# Patient Record
Sex: Female | Born: 1985 | Race: White | Hispanic: No | Marital: Married
Health system: Southern US, Community
[De-identification: ages and names within clinical notes are randomized; demographics above are authoritative.]

## PROBLEM LIST (undated history)

## (undated) DIAGNOSIS — D249 Benign neoplasm of unspecified breast: Secondary | ICD-10-CM

## (undated) HISTORY — DX: Benign neoplasm of unspecified breast: D24.9

## (undated) HISTORY — PX: ANTERIOR CRUCIATE LIGAMENT REPAIR: SHX115

---
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
## 2007-03-30 ENCOUNTER — Encounter (INDEPENDENT_AMBULATORY_CARE_PROVIDER_SITE_OTHER): Payer: Self-pay | Admitting: Obstetrics and Gynecology

## 2007-03-30 ENCOUNTER — Observation Stay (HOSPITAL_COMMUNITY): Admission: AD | Admit: 2007-03-30 | Discharge: 2007-03-31 | Payer: Self-pay | Admitting: Obstetrics and Gynecology

## 2007-03-30 IMAGING — US US OB COMP LESS 14 WK
1 series · 14 of 28 positions shown · non-contrast
Comparison: none

HISTORY: Early pregnancy, bleeding, possible miscarriage, LMP [DATE]

[Series 1: us pelvis complete · 14 of 45 slices shown]
[im 2/45]
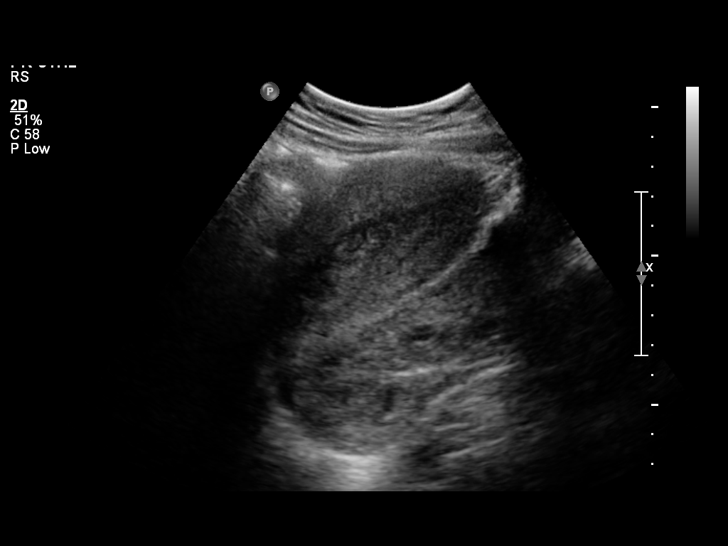
[im 5/45]
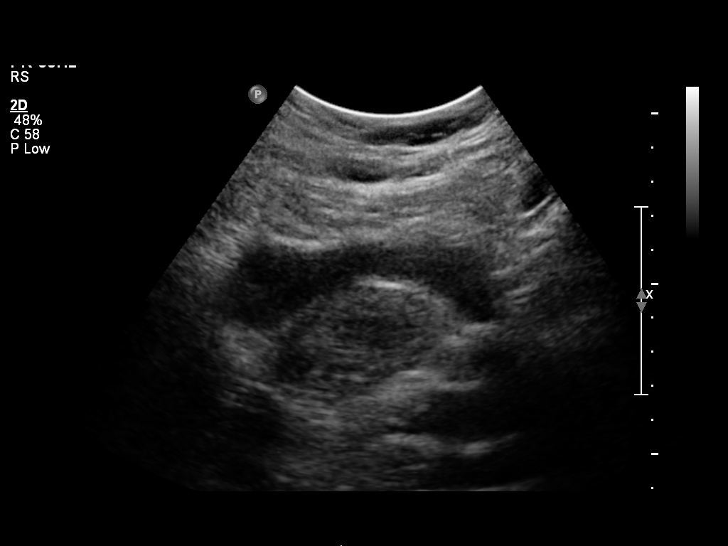
[im 9/45]
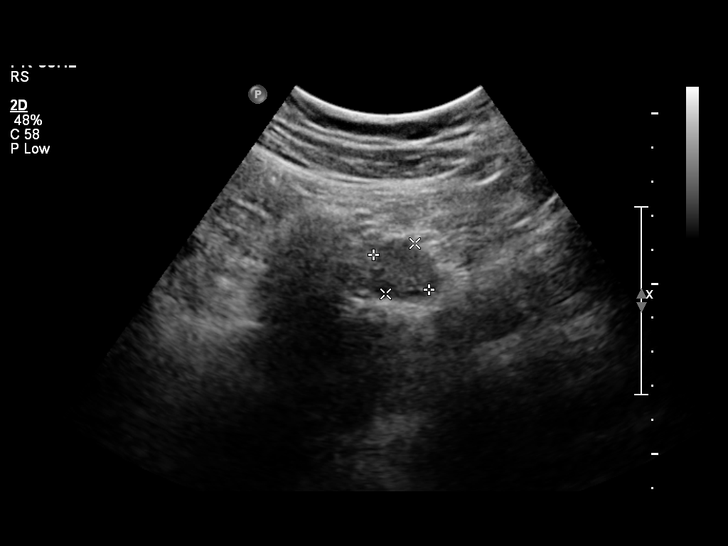
[im 12/45]
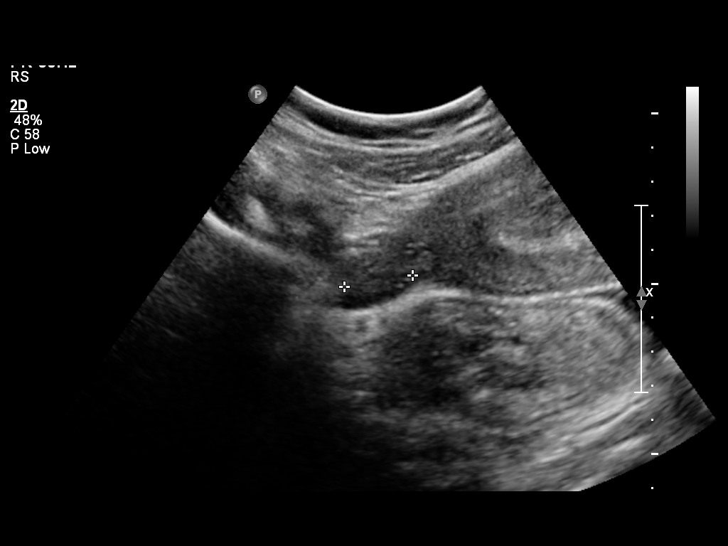
[im 15/45]
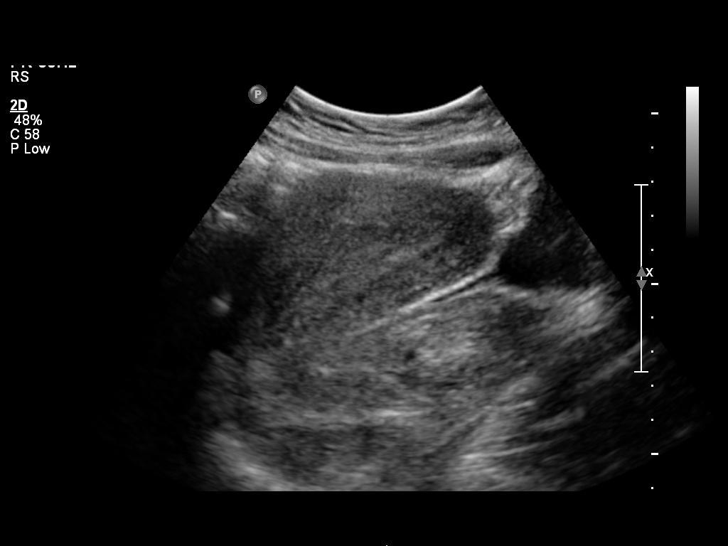
[im 18/45]
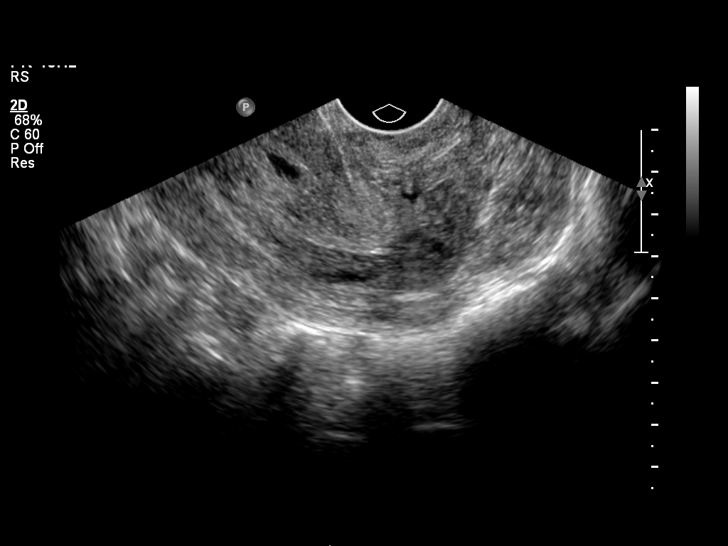
[im 22/45]
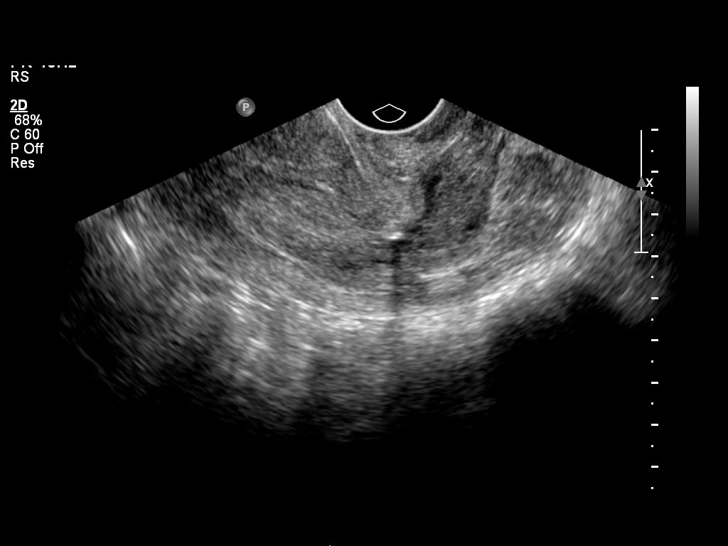
[im 25/45]
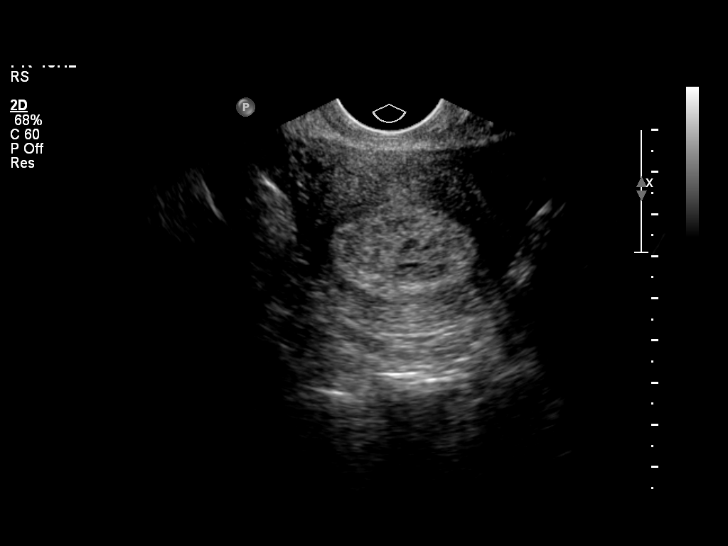
[im 28/45]
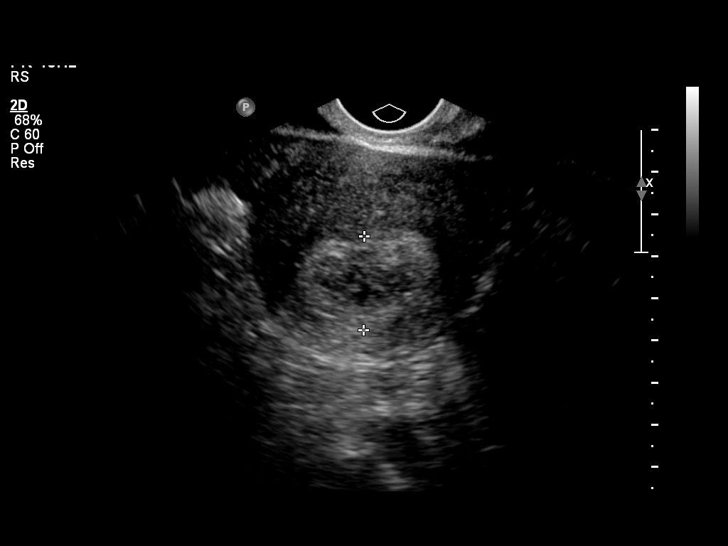
[im 31/45]
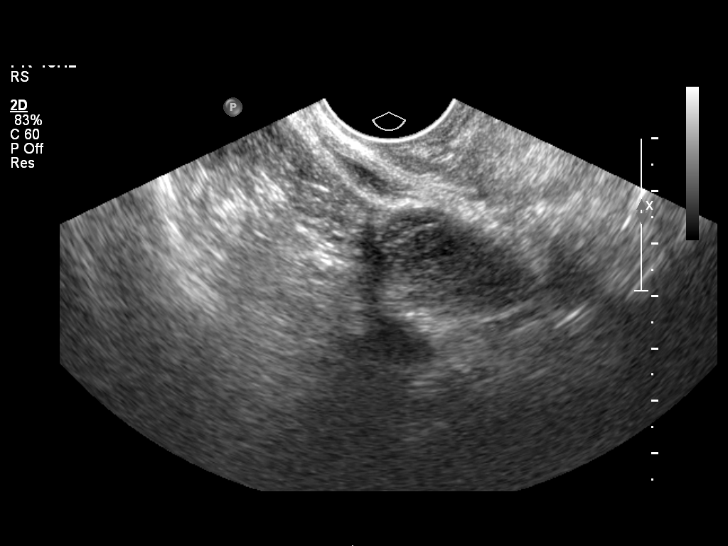
[im 35/45]
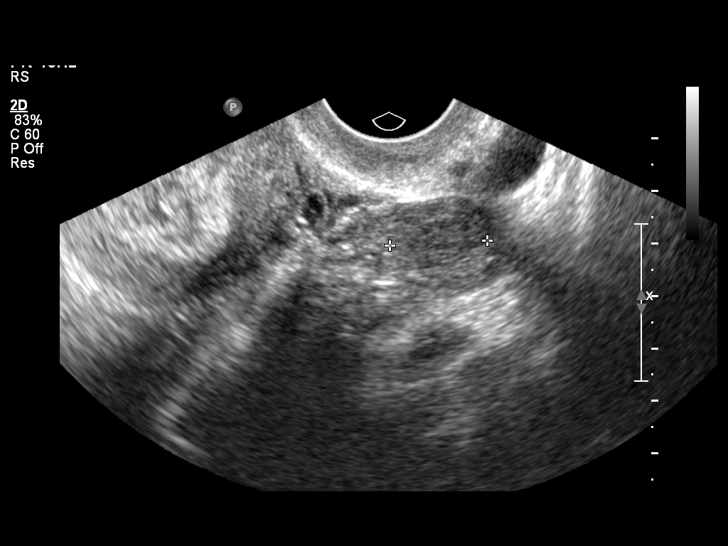
[im 38/45]
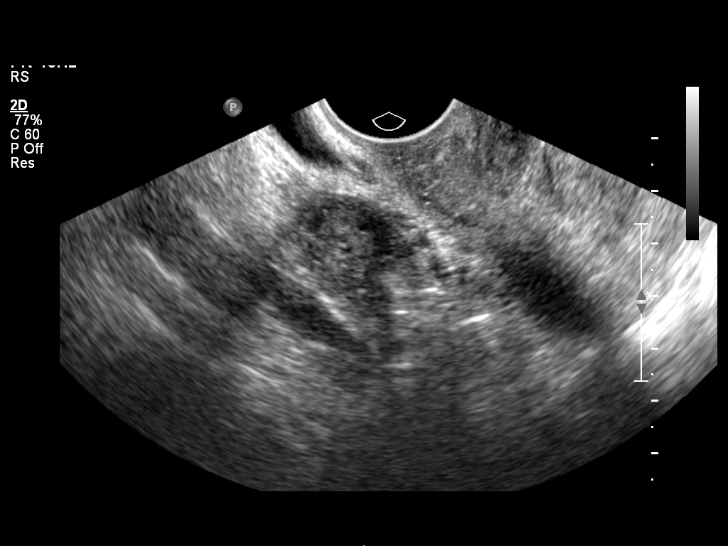
[im 41/45]
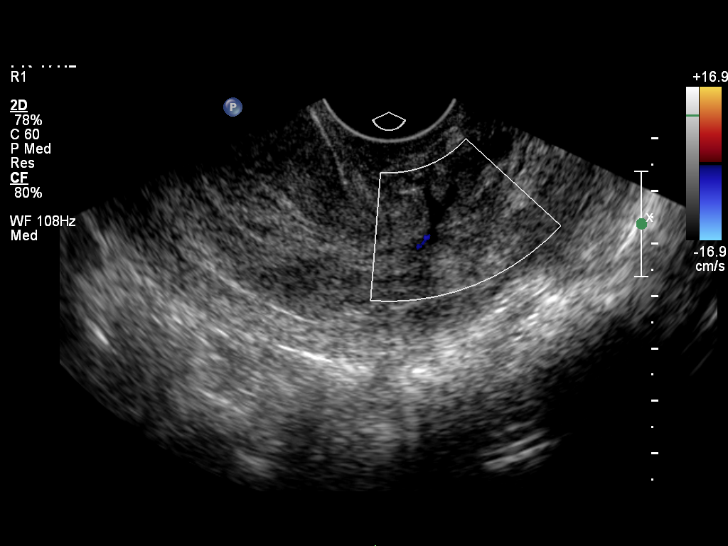
[im 45/45]
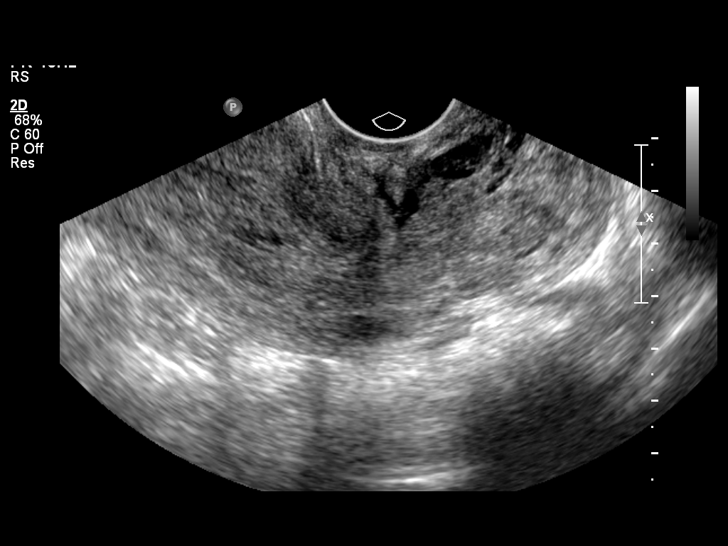

[14 of 28 positions shown; findings below may reference images not displayed]

ULTRASOUND OB COMPLETE LESS THAN 14 WEEKS:
ULTRASOUND OB TRANSVAGINAL MODIFY:

Transabdominal and endovaginal sonography of the gravid pelvis performed.

No gestational sac seen within uterus.
Minimal endometrial fluid.
Endometrial complex is thickened and irregular, hypoechoic, up [L7] cm
diameter, question blood.
This extends to the cervix.
Left ovary normal size and morphology, 2.7 x 1.3 x 1.9 cm.
Right ovary normal size and morphology, 2.8 x 1.5 x 1.3 cm.
No adnexal masses or free pelvic fluid.
IMPRESSION: No identification of intrauterine or extrauterine gestation.
With quantitative beta-hCG level of [L7], differential diagnosis includes
spontaneous abortion and ectopic pregnancy, though no secondary signs of ectopic
pregnancy are identified.
Serial quantitative beta-hCG and/or ultrasound followup recommended to confirm
spontaneous abortion and exclude ectopic pregnancy.

## 2007-11-09 ENCOUNTER — Encounter: Admission: RE | Admit: 2007-11-09 | Discharge: 2007-11-09 | Payer: Self-pay | Admitting: Obstetrics and Gynecology

## 2011-01-08 NOTE — H&P (Signed)
NAME:  Jamie Daniel, Jamie Daniel NO.:  [id number]   MEDICAL RECORD NO.:  [id number]          PATIENT TYPE:  MAT   LOCATION:  [facility]                          FACILITY:  [facility]   PHYSICIAN:  Naima A. Dillard, M.D. DATE OF BIRTH:  Mar 24, 1986   DATE OF ADMISSION:  03/30/2007  DATE OF DISCHARGE:                              HISTORY & PHYSICAL   This is a 25 year old gravida 1, para 0 at 12-2/7th's weeks' gestation  who presents with heavy bleeding.  She was diagnosed with a missed  abortion on March 19, 2007, and started having heavy vaginal bleeding  today.  Soaking greater than one pad per hour.  She declined a D&E and  did pass a lot of blood clots and tissue while in the maternity  admissions unit per Dr. Normand Sloop.   ALLERGIES:  None.   OBSTETRICAL HISTORY:  The patient is a primigravida.   MEDICAL HISTORY:  Unremarkable.   SURGICAL HISTORY:  Remarkable for:  1. Knee surgery.  2. Bilateral inguinal hernia repairs.   FAMILY HISTORY:  Remarkable for father with hypertension and grandfather  with diabetes.   GENETIC HISTORY:  Negative.   SOCIAL HISTORY:  The patient is married.  Father of the baby is involved  and supportive.  She does not report a religious affiliation.  She  denies any alcohol, tobacco, or drug use.   MEDICATIONS:  Midol and Motrin.   PRENATAL LABORATORY:  Blood type is O negative.   OBJECTIVE DATA:  VITAL SIGNS:  Stable but the patient is orthostatic  with heart rate going from 79 to 104 from lying to sitting and the  patient is unable to tolerate standing without feeling faint.  HEENT:  Within normal limits.  Thyroid normal, not enlarged.  CHEST:  Clear to auscultation.  HEART:  Regular rate and rhythm.  ABDOMEN:  Soft and nontender.  PELVIC:  Showed cervix which was open fingertip and large blood clots  and products of conception were present at cervical os and removed by  Dr. Normand Sloop.  There was moderate vaginal bleeding which has subsided.  EXTREMITIES:  Within normal limits.   Ultrasound showed no gestation sack in the uterus, minimal endometrial  fluid, endometrial stripe of 1.9-cm with blood seen and the uterus  extending to the cervix.  Ovaries were normal.  There were no adnexal  masses.  Hemoglobin was initially 11.5, and decreased to 10.1.   ASSESSMENT:  1. Intrauterine pregnancy at 12 weeks and 2 days' gestation.  2. Spontaneous abortion.  3. Orthostasis related to blood loss.   PLAN:  1. Admit to 3rd floor for overnight observation.  2. Bed rest with bathroom privileges with assistance.  3. Methergine series.  4. CBC and Rhophylac in a.m.  5. Pad count.  6. Regular diet.  7. Motrin p.r.n.  8. Further orders to follow.      Marie L. Williams, C.N.M.      Naima A. Normand Sloop, M.D.  Electronically Signed    MLW/MEDQ  D:  03/30/2007  T:  03/30/2007  Job:  [id number]

## 2011-01-08 NOTE — Discharge Summary (Signed)
NAME:  Jamie Daniel, Jamie Daniel NO.:  [id number]   MEDICAL RECORD NO.:  [id number]          PATIENT TYPE:  OBV   LOCATION:  9302                          [facility]ACILITY:  [facility]   PHYSICIAN:  Hal Morales, M.D.DATE OF BIRTH:  [birth year]   DATE OF ADMISSION:  03/30/2007  DATE OF DISCHARGE:  03/31/2007                               DISCHARGE SUMMARY   ADMITTING DIAGNOSES:  1. Intrauterine pregnancy at 12-1/7 weeks with a missed abortion.  2. Heavy vaginal bleeding.   PROCEDURES:  None.   HOSPITAL COURSE:  Ms. [name] is a 25 year old gravida 1, para 0 at 41-  2/[redacted] weeks gestation who presented with heavy bleeding on the evening of  March 30, 2007.  Her history had been remarkable for diagnosis with a  missed abortion on March 19, 2007, which she had elected to maintain  observation and began to have heavy vaginal bleeding today.  On  admission to the maternity admissions unit, the patient was orthostatic  with heart rate changes.  She was unable to tolerate standing without  feeling faint.  Cervix was fingertip dilated with large blood clots and  products of conception being present at the cervical os.  These were  removed by Dr. Normand Sloop and there was moderate vaginal bleeding which  subsided at that time.  Ultrasound showed no gestational sac in the  uterus.  Minimal endometrial fluid, endometrial stripe of 1.9 cm with  blood seen and the ovaries were normal.  No adnexal masses.  Hemoglobin  was initially 11.5 on admission and did decrease to 10.1 during the  patient's MAU stay.  The decision was made to admit her for observation  through the night.  She did receive Methergine.  She tolerated a regular  diet.  She did receive RhoGAM.  By the morning of March 31, 2007, the  patient was feeling much better.  She was up to the bathroom without any  dizziness.  She tolerated a regular diet.  Blood pressures were in the  90s/50s, pulse was 70.  Abdomen was soft and nontender.   Bleeding was  minimal.  Hemoglobin was 9.  The patient was evaluated by Dr. Pennie Rushing  and was deemed to have received full benefit of her hospital stay and  was discharged home.   DISCHARGE INSTRUCTIONS:  The patient is to call our office with any  increase in vaginal bleeding, increased pain or any other problems.   DISCHARGE MEDICATIONS:  1. Doxycycline 100 mg p.o. b.i.d. x7 days.  2. Methergine 0.2 mg p.o. q.6h. x24 hours.  3. Motrin 600 mg p.o. q.6h. p.r.n. pain.   FOLLOW UP:  Discharge follow-up will occur with Dr. Normand Sloop in one week  at [facility] or p.r.n.      Renaldo Reel Emilee Hero, C.N.M.      Hal Morales, M.D.  Electronically Signed    VLL/MEDQ  D:  03/31/2007  T:  03/31/2007  Job:  [id number]

## 2011-01-08 NOTE — H&P (Signed)
NAME:  Jamie Daniel, Jamie Daniel             ACCOUNT NO.:  [id number]   MEDICAL RECORD NO.:  [id number]          PATIENT TYPE:  OBV   LOCATION:  9302                          [facility]   PHYSICIAN:  Naima A. Dillard, M.D. DATE OF BIRTH:  [birth year]   DATE OF ADMISSION:  03/30/2007  DATE OF DISCHARGE:                              HISTORY & PHYSICAL   CHIEF COMPLAINT:  Incomplete abortion.   PRESENT ILLNESS:  The patient was diagnosed with a missed AB measuring 6  weeks on March 19, 2007.  She chose observation and today she started  having heavy vaginal bleeding, soaking greater than a pad an hour.  There was no leakage of fluid, no shortness of breath and no chest pain.   PAST MEDICAL HISTORY:  Unremarkable.   PAST SURGICAL HISTORY:  Significant for knee surgery and bilateral  inguinal repair.   FAMILY HISTORY:  Significant for chronic hypertension and aortic  dissection the father, and diabetes in a grandfather.   SOCIAL HISTORY:  No tobacco or alcohol use.   The patient has no known drug allergies.   MEDICATIONS:  Include Midol and Motrin as needed.   REVIEW OF SYSTEMS:  GENITOURINARY:  As above.  MUSCULOSKELETAL:  No  weakness.  HEME:  No bleeding disorder.  CARDIOVASCULAR:  No heart  palpitations.  GI:  No constipation.   PHYSICAL EXAMINATION:  She is afebrile with stable vital signs.  She was  tachycardic on admission but as her bleeding slowed down and she became  less anxious, it normalized.  She is in no apparent distress.  HEAD:  Normocephalic and atraumatic.  NECK:  Has free range of motion.  CHEST:  Clear to auscultation bilaterally.  HEART:  Has regular rate and rhythm.  ABDOMEN:  Soft and nontender.  Vulva and vaginal exam is within normal limits.  Cervix is about a  fingertip dilated and large blood clots and products of conception were  seen at the os which were removed with a sponge stick.  EXTREMITIES:  Have no cyanosis, clubbing or edema.  SKIN:  Moist  mucosa with normal color in significant areas.  NEUROLOGIC:  Within normal limits.   On ultrasound, there was no gestational sac, just thickened endometrium.  Her beta quantitative was 4188.  Shreeve count is 12.3, hemoglobin is  11.5, platelets are 190.   IMPRESSION:  Incomplete abortion in the first trimester.  The bleeding  seems to have slowed significantly.  The patient was offered a D&C and  told the risks are bleeding, infection, formation of Asherman's syndrome  or scar tissue in the uterus and perforation of the uterus, versus  observation which could lead to heavy bleeding and needing a D&C anyway  or infection.  The patient chose to have observation because her  bleeding has slowed down.  She will go home with a pad count and call if  she soaks more than one pad an hour.  She will take Methergine for 24  hours, Motrin as needed, and doxycycline twice a day for 7 days.  She  will come to our office weekly for beta  hCG quantitative and the  products of conception removed from the os were sent to pathology.      Naima A. Normand Sloop, M.D.  Electronically Signed     NAD/MEDQ  D:  03/30/2007  T:  03/31/2007  Job:  [id number]

## 2011-01-11 NOTE — Op Note (Signed)
[place]  Patient:    Jamie Daniel, Jamie Daniel                      MRN: [id number] Proc. Date: 12/13/99 Adm. Date:  [id number] Attending:  Marcene Corning                           Operative Report  PREOPERATIVE DIAGNOSIS: 1. Right knee anterior cruciate ligament tear. 2. Right knee torn medial meniscus.  POSTOPERATIVE DIAGNOSIS: 1. Right knee anterior cruciate ligament tear. 2. Right knee loose body.  OPERATION PERFORMED: 1. Right knee anterior cruciate ligament reconstruction. 2. Right knee loose body removal.  ANESTHESIA:  General.  ATTENDING SURGEON:  Lubertha Basque. Jerl Santos, M.D.  ASSISTANT:  Lindwood Qua, P.A.  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  She experienced a pop and an immediate effusion.  On exam, she was noted to have lack of full extension and a large effusion with laxity consistent with an ACL tear.  She worked on range of motion and decrease in effusion and eventually achieved almost full extension but still lacked the hyperextension she had on the opposite side.  Her knee felt loose and buckled again about a week prior to this procedure.  She was offered ACL reconstruction.  The procedure was discussed with the patient and informed operative consent was obtained after discussion of possible complications of reaction to anesthesia and infection.  DESCRIPTION OF PROCEDURE:  The patient was taken to an operating suite where general anesthetic was applied without difficulty.  On examination, she had a great deal of laxity with no firm end point.  She had a positive pivot shift already as well.  Passively, she could hyperextend equal to the opposite knee when she was asleep.  She still had about a 1+ effusion.  She was prepped and draped in normal sterile fashion.  After the administration of preop intravenous antibiotics, an  arthroscopy of the right knee was performed through two inferior portals.  Suprapatellar pouch was benign as was the patellofemoral joint.  Both gutters were benign.  Medial compartment exhibited no evidence of meniscal or articular cartilage injury.  Lateral compartment exhibited a cartilaginous loose body which was removed but no evidence of meniscal or articular cartilage injury in that compartment.  The notch was notable for complete ACL rupture.  The PCL was intact.  The residual ACL was removed.  A generous notchplasty was performed with an arthroscopic bur.  At this point the leg was elevated, exsanguinated, and a tourniquet inflated about the thigh.  Through a small anteromedial incision along the medial border of the patellar tendon, the middle third of the structure was harvested along with contiguous bone pieces from the patella and tibial tubercle area. This was then taken to a back table where my assistant had this graft contoured so it would fit through 9 and 10 mm tunnels.  Drill holes were placed in both the bone blocks and PDS sutures were placed in one set of drill holes while a wire was placed in the other.  A guide was placed into the knee and a guide wire was placed using this guide to enter the knee just anterior to the PCL.  This was overreamed to a diameter of 10 mm.  A transtibial guide was then placed in the over-the-top position and utilized to  place the second longer guide wire through the tibial tunnel, through this guide, and through the distal femur out the proximal thigh.  Using this, a 9 mm tunnel was created on the posterior aspect of the femur to a depth of about 30 mm.  Care was taken to make sure that a 1 or 2 mm posterior wall remained.  A full radius resector was placed in the knee to remove bony and cartilaginous debris.  The aforementioned graft was then pulled through the tibial tunnel and into the femoral tunnel utilizing the PDS sutures which were  passed through the head of the last guide wire and pulled out the proximal thigh. Care was taken to make sure that the bony surface of the graft was left in the anterior position while the tendinous surface was facing posteriorly.  A guide wire was placed in an anterior position in the femoral tunnel.  An 8 x 25 metal Linvatek screw was placed over this guide wire to secure the graft in interference fashion.  Attention was then placed on the trailing portion of the graft with the attached wire and the leading portion could not be dislodged.  The knee came to full hyperextension with no significant impingement.  The graft was also felt to be very isometric and did not tighten even at full extension.  A second guide wire was placed between the bone of the tibial tunnel and the second bone plug.  This was seen to enter the knee arthroscopically.  A second identical screw was placed in interference fashion to secure the trailing bone plug in the tibial tunnel.  The graft felt tight throughout a range of motion and again did not impinge.  Her Lachman test was tight and the pivot shift eliminated.  The knee was thoroughly irrigated at the end of the case followed by placement of Marcaine with epinephrine and morphine.  The tourniquet was deflated and residual bone chips and graft preparation were placed into the patellar defect.  0 Vicryl was used to close peritenon in a running fashion followed by subcutaneous reapproximation with 2-0 undyed Vicryl and skin closure with nylon.  Adaptic was placed over the wound followed by dry gauze and a loose Ace wrap.  Estimated blood loss and intraoperative fluids can be obtained from Anesthesia records as can tourniquet time which was between 30 and 40 minutes.  DISPOSITION:  The patient was extubated in the operating room and taken to the recovery room in stable condition.  Plans were for her to stay overnight with probable discharge home in the morning.  She will start CPM immediately and will weightbear as tolerated this afternoon. DD:  12/13/99 TD:  12/13/99 Job: [id number]

## 2011-06-10 LAB — HEMOGLOBIN AND HEMATOCRIT, BLOOD
HCT: 29.3 — ABNORMAL LOW
Hemoglobin: 10.1 — ABNORMAL LOW

## 2011-06-10 LAB — DIFFERENTIAL
Basophils Absolute: 0
Basophils Relative: 0
Eosinophils Absolute: 0.1
Eosinophils Relative: 1
Lymphocytes Relative: 22
Lymphs Abs: 1.9
Monocytes Absolute: 0.7
Monocytes Relative: 7
Neutro Abs: 6.1
Neutrophils Relative %: 70

## 2011-06-10 LAB — ABO/RH: ABO/RH(D): O NEG

## 2011-06-10 LAB — CBC
HCT: 26.3 — ABNORMAL LOW
HCT: 34.3 — ABNORMAL LOW
Hemoglobin: 11.5 — ABNORMAL LOW
MCHC: 33.7
MCHC: 34.4
MCV: 90.7
MCV: 91.4
Platelets: 173
Platelets: 190
RBC: 3.75 — ABNORMAL LOW
RDW: 12.5
RDW: 12.6
WBC: 12.3 — ABNORMAL HIGH
WBC: 8.7

## 2011-06-10 LAB — RH IMMUNE GLOBULIN WORKUP (NOT WOMEN'S HOSP)
ABO/RH(D): O NEG
Antibody Screen: NEGATIVE

## 2011-06-10 LAB — HCG, QUANTITATIVE, PREGNANCY: hCG, Beta Chain, Quant, S: 4188 — ABNORMAL HIGH

## 2012-01-01 ENCOUNTER — Other Ambulatory Visit: Payer: Self-pay | Admitting: Obstetrics and Gynecology

## 2012-01-01 MED ORDER — ETONOGESTREL-ETHINYL ESTRADIOL 0.12-0.015 MG/24HR VA RING: VAGINAL_RING | VAGINAL | Status: DC

## 2012-01-01 NOTE — Telephone Encounter (Signed)
Triage received/cht already in triage for prior call today

## 2012-01-01 NOTE — Telephone Encounter (Signed)
Spoke with pt rgd msg pt need refill on rx nuvaring pt has aex sch 01/2012 advised pt will send rx to pharm pt voice understanding per protocol rx sent to pharm

## 2012-01-01 NOTE — Telephone Encounter (Signed)
Nurse received

## 2012-01-27 ENCOUNTER — Encounter: Payer: Self-pay | Admitting: Obstetrics and Gynecology

## 2012-01-28 ENCOUNTER — Ambulatory Visit (INDEPENDENT_AMBULATORY_CARE_PROVIDER_SITE_OTHER): Payer: BC Managed Care – HMO | Admitting: Obstetrics and Gynecology

## 2012-01-28 ENCOUNTER — Encounter: Payer: Self-pay | Admitting: Obstetrics and Gynecology

## 2012-01-28 VITALS — BP 110/68 | HR 70 | Ht 66.0 in | Wt 154.0 lb

## 2012-01-28 DIAGNOSIS — Z124 Encounter for screening for malignant neoplasm of cervix: Secondary | ICD-10-CM

## 2012-01-28 DIAGNOSIS — Z309 Encounter for contraceptive management, unspecified: Secondary | ICD-10-CM

## 2012-01-28 DIAGNOSIS — IMO0001 Reserved for inherently not codable concepts without codable children: Secondary | ICD-10-CM

## 2012-01-28 MED ORDER — ETONOGESTREL-ETHINYL ESTRADIOL 0.12-0.015 MG/24HR VA RING: VAGINAL_RING | VAGINAL | Status: DC

## 2012-01-28 NOTE — Progress Notes (Signed)
Last Pap: 12/25/10 WNL: Yes Regular Periods:yes Contraception: Nuvaring  Monthly Breast exam:yes Tetanus<71yrs:yes Nl.Bladder Function:yes Daily BMs:yes Healthy Diet:yes Calcium:yes Mammogram:no Exercise:yes Seatbelt: yes Abuse at home: no Stressful work:no Sigmoid-colonoscopy: n/a Bone Density: No Pt has questions about genetic screening for cancer.  Jimmey Ralph, Charetha BP 110/68  Pulse 70  Ht 5\' 6"  (1.676 m)  Wt 154 lb (69.854 kg)  BMI 24.86 kg/m2  LMP 01/24/2012 Pt without complaints Physical Examination: General appearance - alert, well appearing, and in no distress Mental status - normal mood, behavior, speech, dress, motor activity, and thought processes Neck - supple, no significant adenopathy, thyroid exam: thyroid is normal in size without nodules or tenderness Chest - clear to auscultation, no wheezes, rales or rhonchi, symmetric air entry Heart - normal rate and regular rhythm Abdomen - soft, nontender, nondistended, no masses or organomegaly Breasts - breasts appear normal, no suspicious masses, no skin or nipple changes or axillary nodes Pelvic - normal external genitalia, vulva, vagina, cervix, uterus and adnexa Rectal - normal rectal, no masses, rectal exam not indicated Back exam - full range of motion, no tenderness, palpable spasm or pain on motion Neurological - alert, oriented, normal speech, no focal findings or movement disorder noted Musculoskeletal - no joint tenderness, deformity or swelling Extremities - no edema, redness or tenderness in the calves or thighs Skin - normal coloration and turgor, no rashes, no suspicious skin lesions noted Routine exam Pap sent yes Mammogram due no *Nuvaring rx and sample RT one year Info given on genetic counseling pt with relatives with breast and ovarian cancer

## 2012-01-30 LAB — PAP IG, CT-NG, RFX HPV ASCU: GC Probe Amp: NEGATIVE

## 2012-06-22 ENCOUNTER — Other Ambulatory Visit: Payer: Self-pay | Admitting: Obstetrics and Gynecology

## 2014-06-27 ENCOUNTER — Encounter: Payer: Self-pay | Admitting: Obstetrics and Gynecology

## 2018-06-19 ENCOUNTER — Other Ambulatory Visit: Payer: Self-pay | Admitting: Obstetrics and Gynecology

## 2018-06-19 DIAGNOSIS — R922 Inconclusive mammogram: Secondary | ICD-10-CM

## 2018-07-01 ENCOUNTER — Other Ambulatory Visit: Payer: Self-pay

## 2018-07-07 ENCOUNTER — Other Ambulatory Visit: Payer: Self-pay | Admitting: Obstetrics and Gynecology

## 2018-07-07 ENCOUNTER — Ambulatory Visit
Admission: RE | Admit: 2018-07-07 | Discharge: 2018-07-07 | Disposition: A | Discharge status: Home / Self Care | Payer: 59 | Source: Ambulatory Visit | Attending: Obstetrics and Gynecology | Admitting: Obstetrics and Gynecology

## 2018-07-07 DIAGNOSIS — R922 Inconclusive mammogram: Secondary | ICD-10-CM

## 2018-07-08 ENCOUNTER — Encounter: Payer: Self-pay | Admitting: Obstetrics and Gynecology

## 2018-08-20 ENCOUNTER — Other Ambulatory Visit: Payer: Self-pay | Admitting: Obstetrics and Gynecology

## 2018-08-20 DIAGNOSIS — Z1501 Genetic susceptibility to malignant neoplasm of breast: Secondary | ICD-10-CM

## 2019-03-03 ENCOUNTER — Other Ambulatory Visit: Payer: Self-pay | Admitting: Obstetrics and Gynecology

## 2019-03-03 DIAGNOSIS — R922 Inconclusive mammogram: Secondary | ICD-10-CM

## 2019-07-14 LAB — OB RESULTS CONSOLE RPR: RPR: NONREACTIVE

## 2019-07-14 LAB — OB RESULTS CONSOLE RUBELLA ANTIBODY, IGM: Rubella: IMMUNE

## 2019-07-14 LAB — OB RESULTS CONSOLE HEPATITIS B SURFACE ANTIGEN: Hepatitis B Surface Ag: NEGATIVE

## 2019-07-14 LAB — OB RESULTS CONSOLE GC/CHLAMYDIA
Chlamydia: NEGATIVE
Gonorrhea: NEGATIVE

## 2019-07-14 LAB — OB RESULTS CONSOLE HIV ANTIBODY (ROUTINE TESTING): HIV: NONREACTIVE

## 2019-08-27 NOTE — L&D Delivery Note (Signed)
Delivery Note Pt pushed very well for 1.5 hours.  At 8:30 PM a viable and healthy female was delivered via Vaginal, Spontaneous (Presentation: Left Occiput Anterior).  APGAR: 8, 9; weight P .   Placenta status: Spontaneous, Intact.  Cord: 3 vessels with the following complications: Nuchal x 1.    Anesthesia: Epidural Episiotomy: None Lacerations: 2nd degree Suture Repair: 3.0 vicryl rapide Est. Blood Loss (mL): 232cc  Mom to postpartum.  Baby to Couplet care / Skin to Skin.  Jamie Daniel 02/11/2020, 8:57 PM  O neg/RI/Tdap in PNC/Br/Bo/Contra ?

## 2020-01-18 LAB — OB RESULTS CONSOLE GBS: GBS: NEGATIVE

## 2020-02-10 ENCOUNTER — Encounter (HOSPITAL_COMMUNITY): Payer: Self-pay | Admitting: Obstetrics and Gynecology

## 2020-02-10 ENCOUNTER — Other Ambulatory Visit: Payer: Self-pay

## 2020-02-10 ENCOUNTER — Inpatient Hospital Stay (HOSPITAL_COMMUNITY)
Admission: RE | Admit: 2020-02-10 | Discharge: 2020-02-13 | DRG: 806 | Disposition: A | Discharge status: Home / Self Care | Payer: 59 | Attending: Obstetrics and Gynecology | Admitting: Obstetrics and Gynecology

## 2020-02-10 ENCOUNTER — Inpatient Hospital Stay (HOSPITAL_COMMUNITY): Payer: 59 | Admitting: Anesthesiology

## 2020-02-10 DIAGNOSIS — O26893 Other specified pregnancy related conditions, third trimester: Secondary | ICD-10-CM | POA: Diagnosis present

## 2020-02-10 DIAGNOSIS — Z3A39 39 weeks gestation of pregnancy: Secondary | ICD-10-CM

## 2020-02-10 DIAGNOSIS — O9081 Anemia of the puerperium: Secondary | ICD-10-CM | POA: Diagnosis not present

## 2020-02-10 DIAGNOSIS — Z6791 Unspecified blood type, Rh negative: Secondary | ICD-10-CM | POA: Diagnosis not present

## 2020-02-10 DIAGNOSIS — O139 Gestational [pregnancy-induced] hypertension without significant proteinuria, unspecified trimester: Secondary | ICD-10-CM | POA: Diagnosis present

## 2020-02-10 DIAGNOSIS — O134 Gestational [pregnancy-induced] hypertension without significant proteinuria, complicating childbirth: Secondary | ICD-10-CM | POA: Diagnosis present

## 2020-02-10 DIAGNOSIS — D62 Acute posthemorrhagic anemia: Secondary | ICD-10-CM | POA: Diagnosis not present

## 2020-02-10 DIAGNOSIS — Z20822 Contact with and (suspected) exposure to covid-19: Secondary | ICD-10-CM | POA: Diagnosis present

## 2020-02-10 DIAGNOSIS — R030 Elevated blood-pressure reading, without diagnosis of hypertension: Secondary | ICD-10-CM | POA: Diagnosis present

## 2020-02-10 LAB — CBC WITH DIFFERENTIAL/PLATELET
Abs Immature Granulocytes: 0.08 10*3/uL — ABNORMAL HIGH (ref 0.00–0.07)
Basophils Absolute: 0 10*3/uL (ref 0.0–0.1)
Basophils Relative: 0 %
Eosinophils Absolute: 0.1 10*3/uL (ref 0.0–0.5)
Eosinophils Relative: 1 %
HCT: 41.7 % (ref 36.0–46.0)
Hemoglobin: 13.9 g/dL (ref 12.0–15.0)
Immature Granulocytes: 1 %
Lymphocytes Relative: 16 %
Lymphs Abs: 1.4 10*3/uL (ref 0.7–4.0)
MCH: 30.5 pg (ref 26.0–34.0)
MCHC: 33.3 g/dL (ref 30.0–36.0)
MCV: 91.6 fL (ref 80.0–100.0)
Monocytes Absolute: 0.6 10*3/uL (ref 0.1–1.0)
Monocytes Relative: 7 %
Neutro Abs: 6.5 10*3/uL (ref 1.7–7.7)
Neutrophils Relative %: 75 %
Platelets: 223 10*3/uL (ref 150–400)
RBC: 4.55 MIL/uL (ref 3.87–5.11)
RDW: 15.5 % (ref 11.5–15.5)
WBC: 8.7 10*3/uL (ref 4.0–10.5)
nRBC: 0 % (ref 0.0–0.2)

## 2020-02-10 LAB — COMPREHENSIVE METABOLIC PANEL
ALT: 21 U/L (ref 0–44)
AST: 20 U/L (ref 15–41)
Albumin: 2.7 g/dL — ABNORMAL LOW (ref 3.5–5.0)
Alkaline Phosphatase: 151 U/L — ABNORMAL HIGH (ref 38–126)
Anion gap: 10 (ref 5–15)
BUN: 7 mg/dL (ref 6–20)
CO2: 22 mmol/L (ref 22–32)
Calcium: 9.5 mg/dL (ref 8.9–10.3)
Chloride: 103 mmol/L (ref 98–111)
Creatinine, Ser: 0.78 mg/dL (ref 0.44–1.00)
GFR calc Af Amer: >60 mL/min (ref >60)
GFR calc non Af Amer: >60 mL/min (ref >60)
Glucose, Bld: 124 mg/dL — ABNORMAL HIGH (ref 70–99)
Potassium: 4 mmol/L (ref 3.5–5.1)
Sodium: 135 mmol/L (ref 135–145)
Total Bilirubin: 0.4 mg/dL (ref 0.3–1.2)
Total Protein: 6.5 g/dL (ref 6.5–8.1)

## 2020-02-10 LAB — TYPE AND SCREEN
ABO/RH(D): O NEG
Antibody Screen: NEGATIVE

## 2020-02-10 LAB — PROTEIN / CREATININE RATIO, URINE
Creatinine, Urine: 63.11 mg/dL
Protein Creatinine Ratio: 0.33 mg/mg{Cre} — ABNORMAL HIGH (ref 0.00–0.15)
Total Protein, Urine: 21 mg/dL

## 2020-02-10 LAB — SARS CORONAVIRUS 2 (TAT 6-24 HRS): SARS Coronavirus 2: NEGATIVE

## 2020-02-10 LAB — ABO/RH: ABO/RH(D): O NEG

## 2020-02-10 MED ORDER — ONDANSETRON HCL 4 MG/2ML IJ SOLN
4.0000 mg | Freq: Four times a day (QID) | INTRAMUSCULAR | Status: DC | PRN
  Administered 2020-02-11 (×2): 4 mg via INTRAVENOUS
  Filled 2020-02-10 (×2): qty 2

## 2020-02-10 MED ORDER — ACETAMINOPHEN 325 MG PO TABS: 650.0000 mg | ORAL_TABLET | ORAL | Status: DC | PRN

## 2020-02-10 MED ORDER — MISOPROSTOL 25 MCG QUARTER TABLET
25.0000 ug | ORAL_TABLET | ORAL | Status: DC | PRN
  Administered 2020-02-10: 25 ug via VAGINAL
  Filled 2020-02-10: qty 1

## 2020-02-10 MED ORDER — TERBUTALINE SULFATE 1 MG/ML IJ SOLN: 0.2500 mg | Freq: Once | INTRAMUSCULAR | Status: DC | PRN

## 2020-02-10 MED ORDER — HYDRALAZINE HCL 20 MG/ML IJ SOLN: 10.0000 mg | INTRAMUSCULAR | Status: DC | PRN

## 2020-02-10 MED ORDER — LIDOCAINE HCL (PF) 1 % IJ SOLN
INTRAMUSCULAR | Status: DC | PRN
  Administered 2020-02-10: 5 mL via EPIDURAL

## 2020-02-10 MED ORDER — LACTATED RINGERS IV SOLN: INTRAVENOUS | Status: DC

## 2020-02-10 MED ORDER — LIDOCAINE HCL (PF) 1 % IJ SOLN: 30.0000 mL | INTRAMUSCULAR | Status: DC | PRN

## 2020-02-10 MED ORDER — LABETALOL HCL 5 MG/ML IV SOLN: 20.0000 mg | INTRAVENOUS | Status: DC | PRN

## 2020-02-10 MED ORDER — LABETALOL HCL 5 MG/ML IV SOLN
40.0000 mg | INTRAVENOUS | Status: DC | PRN
Start: 2020-02-10 — End: 2020-02-13

## 2020-02-10 MED ORDER — LACTATED RINGERS IV SOLN: 500.0000 mL | Freq: Once | INTRAVENOUS | Status: DC

## 2020-02-10 MED ORDER — EPHEDRINE 5 MG/ML INJ: 10.0000 mg | INTRAVENOUS | Status: DC | PRN

## 2020-02-10 MED ORDER — SODIUM CHLORIDE (PF) 0.9 % IJ SOLN
INTRAMUSCULAR | Status: DC | PRN
  Administered 2020-02-10: 12 mL/h via EPIDURAL

## 2020-02-10 MED ORDER — PHENYLEPHRINE 40 MCG/ML (10ML) SYRINGE FOR IV PUSH (FOR BLOOD PRESSURE SUPPORT): 80.0000 ug | PREFILLED_SYRINGE | INTRAVENOUS | Status: DC | PRN

## 2020-02-10 MED ORDER — FENTANYL-BUPIVACAINE-NACL 0.5-0.125-0.9 MG/250ML-% EP SOLN
12.0000 mL/h | EPIDURAL | Status: DC | PRN
  Administered 2020-02-11: 12 mL/h via EPIDURAL
  Filled 2020-02-10 (×2): qty 250

## 2020-02-10 MED ORDER — OXYTOCIN-SODIUM CHLORIDE 30-0.9 UT/500ML-% IV SOLN
1.0000 m[IU]/min | INTRAVENOUS | Status: DC
  Administered 2020-02-10: 2 m[IU]/min via INTRAVENOUS
  Filled 2020-02-10: qty 500

## 2020-02-10 MED ORDER — LABETALOL HCL 5 MG/ML IV SOLN: 80.0000 mg | INTRAVENOUS | Status: DC | PRN

## 2020-02-10 MED ORDER — SOD CITRATE-CITRIC ACID 500-334 MG/5ML PO SOLN: 30.0000 mL | ORAL | Status: DC | PRN

## 2020-02-10 MED ORDER — DIPHENHYDRAMINE HCL 50 MG/ML IJ SOLN: 12.5000 mg | INTRAMUSCULAR | Status: DC | PRN

## 2020-02-10 MED ORDER — OXYTOCIN-SODIUM CHLORIDE 30-0.9 UT/500ML-% IV SOLN
5.0000 [IU]/h | INTRAVENOUS | Status: DC
  Administered 2020-02-11: 2.5 [IU]/h via INTRAVENOUS
  Filled 2020-02-10: qty 500

## 2020-02-10 MED ORDER — FENTANYL-BUPIVACAINE-NACL 0.5-0.125-0.9 MG/250ML-% EP SOLN
12.0000 mL/h | EPIDURAL | Status: DC | PRN
Start: 2020-02-10 — End: 2020-02-10

## 2020-02-10 MED ORDER — OXYTOCIN BOLUS FROM INFUSION
333.0000 mL | Freq: Once | INTRAVENOUS | Status: AC
  Administered 2020-02-11: 333 mL via INTRAVENOUS

## 2020-02-10 MED ORDER — LACTATED RINGERS IV SOLN: 500.0000 mL | INTRAVENOUS | Status: DC | PRN

## 2020-02-10 NOTE — Anesthesia Procedure Notes (Signed)
Epidural Patient location during procedure: OB Start time: 02/10/2020 9:22 PM End time: 02/10/2020 9:28 PM  Staffing Anesthesiologist: Effie Berkshire, MD Performed: anesthesiologist   Preanesthetic Checklist Completed: patient identified, IV checked, site marked, risks and benefits discussed, surgical consent, monitors and equipment checked, pre-op evaluation and timeout performed  Epidural Patient position: sitting Prep: ChloraPrep Patient monitoring: heart rate, continuous pulse ox and blood pressure Approach: midline Location: L3-L4 Injection technique: LOR saline  Needle:  Needle type: Tuohy  Needle gauge: 17 G Needle length: 9 cm Catheter type: closed end flexible Catheter size: 20 Guage Test dose: negative and 1.5% lidocaine  Assessment Events: blood not aspirated, injection not painful, no injection resistance and no paresthesia  Additional Notes LOR @ 5.5  Patient identified. Risks/Benefits/Options discussed with patient including but not limited to bleeding, infection, nerve damage, paralysis, failed block, incomplete pain control, headache, blood pressure changes, nausea, vomiting, reactions to medications, itching and postpartum back pain. Confirmed with bedside nurse the patient's most recent platelet count. Confirmed with patient that they are not currently taking any anticoagulation, have any bleeding history or any family history of bleeding disorders. Patient expressed understanding and wished to proceed. All questions were answered. Sterile technique was used throughout the entire procedure. Please see nursing notes for vital signs. Test dose was given through epidural catheter and negative prior to continuing to dose epidural or start infusion. Warning signs of high block given to the patient including shortness of breath, tingling/numbness in hands, complete motor block, or any concerning symptoms with instructions to call for help. Patient was given instructions  on fall risk and not to get out of bed. All questions and concerns addressed with instructions to call with any issues or inadequate analgesia.    Reason for block:procedure for pain

## 2020-02-10 NOTE — H&P (Signed)
Jamie Daniel is a 34 y.o. female presenting for elevated blood pressure at term. Denies PreE symptoms, had 140s-150s/100s in clinic this morning. IN MAU, continued to have such elevated BP but remains asymptomatic. +FM, denies Vb, LOF, CTX.  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 Fibroadenoma of breast    Past Surgical History:  Procedure Laterality Date  . ANTERIOR CRUCIATE LIGAMENT REPAIR    . HERNIA REPAIR  1985   Family History: family history includes Breast cancer (age of onset: 21) in her maternal aunt; Breast cancer (age of onset: 52) in an other family member; Breast cancer (age of onset: 53) in her paternal aunt; Cancer in her maternal aunt, maternal grandmother, and paternal aunt. Social History:  reports that she has never smoked. She has never used smokeless tobacco. She reports previous alcohol use. She reports that she does not use drugs.     Maternal Diabetes: No 1hr 103 Genetic Screening: Declined Maternal Ultrasounds/Referrals: Normal Fetal Ultrasounds or other Referrals:  None Maternal Substance Abuse:  No Significant Maternal Medications:  None Significant Maternal Lab Results:  Group B Strep negative and Rh negative Other Comments:  None  Review of Systems History Dilation: 1 Effacement (%): 20 Exam by:: dr Shaheen Mende Blood pressure (!) 149/91, pulse 91, temperature 98.4 F (36.9 C), temperature source Oral, resp. rate 18, height '5\' 6"'$  (1.676 m), weight 119.3 kg, SpO2 96 %. Exam Physical Exam  Constitutional: She is oriented to person, place, and time. She appears well-developed. No distress.  HENT:  Head: Normocephalic and atraumatic.  Eyes: Pupils are equal, round, and reactive to light.  Cardiovascular: Normal rate and regular  rhythm. Exam reveals no gallop.  No murmur heard. GI: There is no abdominal tenderness. There is no rebound and no guarding.  Genitourinary:    Vagina normal.   Musculoskeletal:        General: Normal range of motion.     Cervical back: Normal range of motion and neck supple.  Neurological: She is alert and oriented to person, place, and time.  Skin: Skin is warm and dry.    Prenatal labs: ABO, Rh: --/--/PENDING (06/17 1417) Antibody: PENDING (06/17 1417) Rubella: Immune (11/18 0000) RPR: Nonreactive (11/18 0000)  HBsAg: Negative (11/18 0000)  HIV: Non-reactive (11/18 0000)  GBS: Negative/-- (05/25 0000)   Assessment/Plan: This is a 34yo G2P0010 @ 39 3/7 by LMP c/w TVUS admitted for IOL for GHTN. PreE labs pending, patient asymptomatic. Plan for PV cytotec followed by AROM and pitocin when amenable. May have epidural  Deliah Boston 02/10/2020, 4:39 PM

## 2020-02-10 NOTE — MAU Provider Note (Signed)
History     CSN: [id number]  Arrival date and time: 02/10/20 1356  First Provider Initiated Contact with Patient 02/10/20 1436     Chief Complaint  Patient presents with  . Hypertension   HPI Jamie Daniel is a 34 y.o. G2P0010 at [redacted]w[redacted]d who presents to MAU from clinic for evaluation of new onset elevated blood pressures. She denies headache, RUQ/epigastric pain, new onset swelling or weight gain. She also denies contractions, vaginal bleeding, leaking of fluid, decreased fetal movement, fever, falls, or recent illness.    She receives care with [facility].  OB History    Gravida  2   Para  0   Term  0   Preterm  0   AB  1   Living        SAB  1   TAB  0   Ectopic  0   Multiple      Live Births              Past Medical History:  Diagnosis Date  . Fibroadenoma of breast     Past Surgical History:  Procedure Laterality Date  . ANTERIOR CRUCIATE LIGAMENT REPAIR    . HERNIA REPAIR  1985    Family History  Problem Relation Age of Onset  . Breast cancer Maternal Aunt 30  . Breast cancer Paternal Aunt 53  . Breast cancer Other 40  . Cancer Maternal Aunt   . Cancer Paternal Aunt   . Cancer Maternal Grandmother     Social History   Tobacco Use  . Smoking status: Never Smoker  . Smokeless tobacco: Never Used  Vaping Use  . Vaping Use: Never used  Substance Use Topics  . Alcohol use: Not Currently    Comment: ocassionally   . Drug use: No    Allergies: No Known Allergies  Medications Prior to Admission  Medication Sig Dispense Refill Last Dose  . Multiple Vitamins-Minerals (MULTIVITAMIN WITH MINERALS) tablet Take 1 tablet by mouth daily.   Past Month at Unknown time  . NUVARING 0.12-0.015 MG/24HR vaginal ring INSERT VAGINALLY AND LEAVE IN PLACE FOR 3 CONSECUTIVE WEEKS, THEN REMOVE FOR 1 WEEK. 1 each 7     Review of Systems  Eyes: Negative for photophobia.  Gastrointestinal: Negative for abdominal pain.  Genitourinary: Negative for  vaginal bleeding.  Neurological: Negative for headaches.  All other systems reviewed and are negative.  Physical Exam   Blood pressure 129/75, pulse (!) 105, temperature 99.1 F (37.3 C), temperature source Oral, resp. rate 20, SpO2 96 %.  Physical Exam  Nursing note and vitals reviewed. Cardiovascular: Normal rate, normal heart sounds and normal pulses.  Respiratory: Effort normal.  GI:  Gravid  Neurological: She is alert.  Psychiatric: Mood normal.    MAU Course  Procedures  Patient Vitals for the past 24 hrs:  BP Temp Temp src Pulse Resp SpO2  02/10/20 1515 129/75 -- -- (!) 105 -- 96 %  02/10/20 1500 (!) 153/90 -- -- (!) 102 -- 96 %  02/10/20 1435 (!) 150/93 -- -- (!) 102 -- 98 %  02/10/20 1425 (!) 149/105 -- -- (!) 114 -- 98 %  02/10/20 1423 -- 99.1 F (37.3 C) Oral -- 20 99 %   --Dr. Wilhelmenia Blase called MAU prior to labs resulting, indicates she would like to induce. --Reactive tracing: baseline 140, mod var, + accels, no decels --Toco: quiet  Results for orders placed or performed during the hospital encounter of 02/10/20 (from the past  24 hour(s))  CBC with Differential/Platelet     Status: Abnormal   Collection Time: 02/10/20  2:16 PM  Result Value Ref Range   WBC 8.7 4.0 - 10.5 K/uL   RBC 4.55 3.87 - 5.11 MIL/uL   Hemoglobin 13.9 12.0 - 15.0 g/dL   HCT 41.7 36 - 46 %   MCV 91.6 80.0 - 100.0 fL   MCH 30.5 26.0 - 34.0 pg   MCHC 33.3 30.0 - 36.0 g/dL   RDW 15.5 11.5 - 15.5 %   Platelets 223 150 - 400 K/uL   nRBC 0.0 0.0 - 0.2 %   Neutrophils Relative % 75 %   Neutro Abs 6.5 1.7 - 7.7 K/uL   Lymphocytes Relative 16 %   Lymphs Abs 1.4 0.7 - 4.0 K/uL   Monocytes Relative 7 %   Monocytes Absolute 0.6 0 - 1 K/uL   Eosinophils Relative 1 %   Eosinophils Absolute 0.1 0 - 0 K/uL   Basophils Relative 0 %   Basophils Absolute 0.0 0 - 0 K/uL   Immature Granulocytes 1 %   Abs Immature Granulocytes 0.08 (H) 0.00 - 0.07 K/uL  Comprehensive metabolic panel     Status:  Abnormal   Collection Time: 02/10/20  2:16 PM  Result Value Ref Range   Sodium 135 135 - 145 mmol/L   Potassium 4.0 3.5 - 5.1 mmol/L   Chloride 103 98 - 111 mmol/L   CO2 22 22 - 32 mmol/L   Glucose, Bld 124 (H) 70 - 99 mg/dL   BUN 7 6 - 20 mg/dL   Creatinine, Ser 0.78 0.44 - 1.00 mg/dL   Calcium 9.5 8.9 - 10.3 mg/dL   Total Protein 6.5 6.5 - 8.1 g/dL   Albumin 2.7 (L) 3.5 - 5.0 g/dL   AST 20 15 - 41 U/L   ALT 21 0 - 44 U/L   Alkaline Phosphatase 151 (H) 38 - 126 U/L   Total Bilirubin 0.4 0.3 - 1.2 mg/dL   GFR calc non Af Amer >60 >60 mL/min   GFR calc Af Amer >60 >60 mL/min   Anion gap 10 5 - 15  Protein / creatinine ratio, urine     Status: Abnormal   Collection Time: 02/10/20  2:18 PM  Result Value Ref Range   Creatinine, Urine 63.11 mg/dL   Total Protein, Urine 21 mg/dL   Protein Creatinine Ratio 0.33 (H) 0.00 - 0.15 mg/mg[Cre]   Assessment and Plan  --34 y.o. G2P0010 --Reactive tracing --PEC without severe features --P:Cr 0.33 --Per Dr. Wilhelmenia Blase, admit to L&D   Darlina Rumpf, CNM 02/10/2020, 4:04 PM

## 2020-02-10 NOTE — Anesthesia Preprocedure Evaluation (Signed)
Anesthesia Evaluation  Patient identified by MRN, date of birth, ID band Patient awake    Reviewed: Allergy & Precautions, Patient's Chart, lab work & pertinent test results  Airway Mallampati: II       Dental   Pulmonary neg pulmonary ROS,    Pulmonary exam normal        Cardiovascular hypertension, Normal cardiovascular exam     Neuro/Psych negative neurological ROS     GI/Hepatic   Endo/Other    Renal/GU      Musculoskeletal   Abdominal   Peds  Hematology   Anesthesia Other Findings   Reproductive/Obstetrics (+) Pregnancy                             Anesthesia Physical Anesthesia Plan  ASA: III  Anesthesia Plan: Epidural   Post-op Pain Management:    Induction:   PONV Risk Score and Plan: 0  Airway Management Planned: Natural Airway  Additional Equipment: None  Intra-op Plan:   Post-operative Plan:   Informed Consent: I have reviewed the patients History and Physical, chart, labs and discussed the procedure including the risks, benefits and alternatives for the proposed anesthesia with the patient or authorized representative who has indicated his/her understanding and acceptance.       Plan Discussed with:   Anesthesia Plan Comments: (Lab Results      Component                Value               Date                      WBC                      8.7                 02/10/2020                HGB                      13.9                02/10/2020                HCT                      41.7                02/10/2020                MCV                      91.6                02/10/2020                PLT                      223                 02/10/2020           )        Anesthesia Quick Evaluation

## 2020-02-10 NOTE — Progress Notes (Signed)
Clear AROM 2143, pitocin at 33mU/min, continue to titrate

## 2020-02-10 NOTE — MAU Note (Signed)
Pt states that she was at a routine OBGYN appointment today and while there had elevated blood pressures.   Pt reports she was told to come to MAU for further evaluation.   Denies vaginal bleeding or LOF.   Reports +FM   Denies h/a, Denies visual changes

## 2020-02-10 NOTE — Progress Notes (Signed)
Labor Note  S: Occ cramping otherwise doing well, denies PreE symptoms  O: BP (!) 148/83   Pulse 82   Temp 98 F (36.7 C) (Axillary)   Resp 16   Ht 5\' 6"  (1.676 m)   Wt 119.3 kg   SpO2 96%   BMI 42.45 kg/m  CE: 2/50/-3 FHR: Baseline 140, +accels, -decels, modvariability TOCO q3-4  A/P: This is a 34 y.o. G2P0010 at [redacted]w[redacted]d  admitted for IOL for PreE w/o SF. FWB: Cat 1 tracing MWB: Requests epidural; Bps 140s-150s/90s pt asymptomatic Labor course: Initiate pitocin and titrate per protocol. Pt requests epidural prior to AROM  Anticipate SVD

## 2020-02-10 NOTE — Progress Notes (Signed)
PreE labs returned as following: uPC 0.33 Cr 0.78 AST/ALT 20/21 13.9/41.7/223  Meets criteria now for PreE without severe features. Continue to monitor. Bps 140s-150s/90

## 2020-02-11 ENCOUNTER — Encounter (HOSPITAL_COMMUNITY): Payer: Self-pay | Admitting: Obstetrics and Gynecology

## 2020-02-11 LAB — RPR: RPR Ser Ql: NONREACTIVE

## 2020-02-11 MED ORDER — BUTORPHANOL TARTRATE 1 MG/ML IJ SOLN
1.0000 mg | Freq: Once | INTRAMUSCULAR | Status: AC
  Administered 2020-02-11: 1 mg via INTRAVENOUS

## 2020-02-11 MED ORDER — MISOPROSTOL 200 MCG PO TABS: 800.0000 ug | ORAL_TABLET | Freq: Once | ORAL | Status: AC

## 2020-02-11 MED ORDER — IBUPROFEN 600 MG PO TABS
600.0000 mg | ORAL_TABLET | Freq: Four times a day (QID) | ORAL | Status: DC
  Administered 2020-02-11 – 2020-02-13 (×7): 600 mg via ORAL
  Filled 2020-02-11 (×5): qty 1

## 2020-02-11 MED ORDER — SODIUM CHLORIDE 0.9 % IV SOLN
2.0000 g | Freq: Two times a day (BID) | INTRAVENOUS | Status: AC
  Administered 2020-02-12 (×2): 2 g via INTRAVENOUS
  Filled 2020-02-11 (×3): qty 2

## 2020-02-11 MED ORDER — MISOPROSTOL 200 MCG PO TABS
ORAL_TABLET | ORAL | Status: AC
  Administered 2020-02-11: 800 ug via RECTAL
  Filled 2020-02-11: qty 4

## 2020-02-11 MED ORDER — BENZOCAINE-MENTHOL 20-0.5 % EX AERO
1.0000 "application " | INHALATION_SPRAY | CUTANEOUS | Status: DC | PRN
  Administered 2020-02-11: 1 via TOPICAL
  Filled 2020-02-11: qty 56

## 2020-02-11 MED ORDER — TRANEXAMIC ACID-NACL 1000-0.7 MG/100ML-% IV SOLN: 1000.0000 mg | INTRAVENOUS | Status: AC

## 2020-02-11 MED ORDER — BUTORPHANOL TARTRATE 1 MG/ML IJ SOLN
INTRAMUSCULAR | Status: AC
  Filled 2020-02-11: qty 1

## 2020-02-11 MED ORDER — TRANEXAMIC ACID-NACL 1000-0.7 MG/100ML-% IV SOLN: 1000.0000 mg | Freq: Once | INTRAVENOUS | Status: DC | PRN

## 2020-02-11 MED ORDER — TRANEXAMIC ACID-NACL 1000-0.7 MG/100ML-% IV SOLN
INTRAVENOUS | Status: AC
  Administered 2020-02-11: 1000 mg via INTRAVENOUS
  Filled 2020-02-11: qty 100

## 2020-02-11 MED ORDER — BUTORPHANOL TARTRATE 1 MG/ML IJ SOLN: 2.0000 mg | Freq: Once | INTRAMUSCULAR | Status: DC

## 2020-02-11 NOTE — Progress Notes (Signed)
Labor Note  S: Resting s/p epidural  O: BP 126/82   Pulse 82   Temp 98 F (36.7 C) (Axillary)   Resp 14   Ht 5\' 6"  (1.676 m)   Wt 119.3 kg   SpO2 97%   BMI 42.45 kg/m  CE : 4/50/-2, softer and more anterior FHR: Baseline 140, +accels, -decels, modvariability TOCO q3-4, pitocin at 19mU/min  A/P: This is a 34 y.o. G2P0010 at [redacted]w[redacted]d  admitted for IOL for PreE w/o SF. FWB: Cat 1 tracing MWB: asx, comfortable Labor course: s/p AROM @ 2143, continue to titrate pitocin  Anticipate SVD

## 2020-02-11 NOTE — Progress Notes (Signed)
Patient ID: Jamie Daniel, female   DOB: 08/13/1986, 34 y.o.   MRN: [id number]  CTSP secondary to trickle of blood, continuously Did not respond to TXA  Pt given stadol for pain control Explained to patient situation and need to explore uterus.  Uterus explored 100-200cc clot remove.  Uterus firm,  Appropriate lochia.  Given cytotec 816mcg pr as well Written to get Cefotetan 2g IV q 12 x 2 doses.

## 2020-02-11 NOTE — Progress Notes (Signed)
Labor Note  S: Occ nausea  O: BP (!) 135/95   Pulse 80   Temp 98.8 F (37.1 C) (Axillary)   Resp 15   Ht 5\' 6"  (1.676 m)   Wt 119.3 kg   SpO2 97%   BMI 42.45 kg/m  CE : 7/80/-1 FHR: Baseline 140, +accels, -decels, modvariability TOCO q3-4, pitocin at 41mU/min  A/P: This is a 34 y.o. G2P0010 at [redacted]w[redacted]d  admitted for IOL for PreE w/o SF. FWB: Cat 1 tracing MWB: asx, comfortable Labor course: s/p AROM @ 2143, continue to titrate pitocin  Anticipate SVD

## 2020-02-11 NOTE — Progress Notes (Signed)
Patient ID: Jamie Daniel, female   DOB: Mar 22, 1986, 34 y.o.   MRN: [id number]  Comfortable with epidural  AFVSS gen NAD FHTs 130-140's, mod var, + accels, + scalp stim, category 1 toco q 1-79min, adequate mVus  SVE 8/90/0-+1  D/W pt still slow progress, however always a possible need for LTCS.

## 2020-02-11 NOTE — Progress Notes (Signed)
Patient ID: Jamie Daniel, female   DOB: 1986/01/05, 34 y.o.   MRN: [id number]  Comfortable w epidural.  AFVSS gen NAD FHTs 130's, mod var, + accel, category 1 toco q 4 min  SVE 7/80/0 IUPC placed w/o diff/comp  Continue IOL, expect SVD

## 2020-02-12 LAB — CBC
HCT: 29.5 % — ABNORMAL LOW (ref 36.0–46.0)
HCT: 33.6 % — ABNORMAL LOW (ref 36.0–46.0)
Hemoglobin: 11.2 g/dL — ABNORMAL LOW (ref 12.0–15.0)
Hemoglobin: 9.8 g/dL — ABNORMAL LOW (ref 12.0–15.0)
MCH: 30.8 pg (ref 26.0–34.0)
MCH: 31 pg (ref 26.0–34.0)
MCHC: 33.2 g/dL (ref 30.0–36.0)
MCHC: 33.3 g/dL (ref 30.0–36.0)
MCV: 92.3 fL (ref 80.0–100.0)
MCV: 93.4 fL (ref 80.0–100.0)
Platelets: 175 10*3/uL (ref 150–400)
Platelets: 187 10*3/uL (ref 150–400)
RBC: 3.16 MIL/uL — ABNORMAL LOW (ref 3.87–5.11)
RBC: 3.64 MIL/uL — ABNORMAL LOW (ref 3.87–5.11)
RDW: 15.7 % — ABNORMAL HIGH (ref 11.5–15.5)
RDW: 15.8 % — ABNORMAL HIGH (ref 11.5–15.5)
WBC: 16.8 10*3/uL — ABNORMAL HIGH (ref 4.0–10.5)
WBC: 17.4 10*3/uL — ABNORMAL HIGH (ref 4.0–10.5)
nRBC: 0 % (ref 0.0–0.2)
nRBC: 0 % (ref 0.0–0.2)

## 2020-02-12 MED ORDER — OXYCODONE HCL 5 MG PO TABS: 10.0000 mg | ORAL_TABLET | ORAL | Status: DC | PRN

## 2020-02-12 MED ORDER — PRENATAL MULTIVITAMIN CH
1.0000 | ORAL_TABLET | Freq: Every day | ORAL | Status: DC
  Administered 2020-02-12 – 2020-02-13 (×2): 1 via ORAL
  Filled 2020-02-12 (×2): qty 1

## 2020-02-12 MED ORDER — WITCH HAZEL-GLYCERIN EX PADS: 1.0000 "application " | MEDICATED_PAD | CUTANEOUS | Status: DC | PRN

## 2020-02-12 MED ORDER — SIMETHICONE 80 MG PO CHEW: 80.0000 mg | CHEWABLE_TABLET | ORAL | Status: DC | PRN

## 2020-02-12 MED ORDER — DIPHENHYDRAMINE HCL 25 MG PO CAPS: 25.0000 mg | ORAL_CAPSULE | Freq: Four times a day (QID) | ORAL | Status: DC | PRN

## 2020-02-12 MED ORDER — DIBUCAINE (PERIANAL) 1 % EX OINT: 1.0000 "application " | TOPICAL_OINTMENT | CUTANEOUS | Status: DC | PRN

## 2020-02-12 MED ORDER — ONDANSETRON HCL 4 MG/2ML IJ SOLN: 4.0000 mg | INTRAMUSCULAR | Status: DC | PRN

## 2020-02-12 MED ORDER — ONDANSETRON HCL 4 MG PO TABS: 4.0000 mg | ORAL_TABLET | ORAL | Status: DC | PRN

## 2020-02-12 MED ORDER — SENNOSIDES-DOCUSATE SODIUM 8.6-50 MG PO TABS: 2.0000 | ORAL_TABLET | ORAL | Status: DC

## 2020-02-12 MED ORDER — OXYCODONE HCL 5 MG PO TABS: 5.0000 mg | ORAL_TABLET | ORAL | Status: DC | PRN

## 2020-02-12 MED ORDER — ZOLPIDEM TARTRATE 5 MG PO TABS: 5.0000 mg | ORAL_TABLET | Freq: Every evening | ORAL | Status: DC | PRN

## 2020-02-12 MED ORDER — COCONUT OIL OIL: 1.0000 "application " | TOPICAL_OIL | Status: DC | PRN

## 2020-02-12 MED ORDER — LACTATED RINGERS IV SOLN: INTRAVENOUS | Status: DC

## 2020-02-12 MED ORDER — ACETAMINOPHEN 325 MG PO TABS: 650.0000 mg | ORAL_TABLET | ORAL | Status: DC | PRN

## 2020-02-12 NOTE — Lactation Note (Signed)
This note was copied from a baby's chart. Lactation Consultation Note  Patient Name: [name] [id number] Date: 02/12/2020  P1,. 8 hour female infant,. As LC was walking in room, Per RN mom had PP hemorrhage and doesn't want to see [facility] services until later today. Infant is current receiving formula at this time.,      Maternal Data    Feeding Feeding Type: Bottle Fed - Formula Nipple Type: Slow - flow  LATCH Score                   Interventions    Lactation Tools Discussed/Used     Consult Status      Jamie Daniel 02/12/2020, 5:12 AM

## 2020-02-12 NOTE — Anesthesia Postprocedure Evaluation (Signed)
Anesthesia Post Note  Patient: Jamie Daniel  Procedure(s) Performed: AN AD HOC LABOR EPIDURAL     Patient location during evaluation: Mother Baby Anesthesia Type: Epidural Level of consciousness: awake and alert Pain management: pain level controlled Vital Signs Assessment: post-procedure vital signs reviewed and stable Respiratory status: spontaneous breathing, nonlabored ventilation and respiratory function stable Cardiovascular status: stable Postop Assessment: no headache, no backache and epidural receding Anesthetic complications: no   No complications documented.  Last Vitals:  Vitals:   02/12/20 0326 02/12/20 0455  BP: 122/81 128/74  Pulse: 97 (!) 110  Resp: 18 17  Temp: 37.2 C 36.9 C  SpO2: 99% 99%    Last Pain:  Vitals:   02/12/20 0455  TempSrc:   PainSc: 5    Pain Goal:                   Rayvon Char

## 2020-02-12 NOTE — Lactation Note (Addendum)
This note was copied from a baby's chart. Lactation Consultation Note  Patient Name: Jamie Daniel [id number] Date: 02/12/2020 Reason for consult: Initial assessment;1st time breastfeeding Baby 10hrs old, recently had a bath. Mom reports last feeding at 11:45am, baby received 30ml formula. Reports unable to latch baby to breast/ baby not interested. Mom's goal is to breastfeed x49yr. Mom with short everted nipples, breast shells are in place, nipples are free of damage bilat. Taught hand expression, mom demonstrated, glimmer of colostrum noted. On oral assessment baby with short thin frenulum, U-shaped tongue on elevation. Baby with weak suck on LC finger. Several attempts to latch baby to left breast cross cradle, baby licked nipple/ opens mouth wide but unable to maintain latch. 54mm nipple shield applied, mom notes sucks at breast, no audible swallows. Switched to right breast football hold using nipple shield, mom reports stronger tugs at breast, 3-4 audible swallows noted. Supplementation sheet given, advised supplement after each feeding at the breast, will reassess feeding plan in 24hrs. Expect feedings to improve, length of feeding and more interest from baby with each feeding.   Advised cue based feedings, 8-12 in 24hrs, cluster feeding, signs of adequate milk transfer, signs of poor feeding. Clean nipple shield after each use. Discussed pumping with DEBP due to nipple shield. This LC will return to room to set up DEBP. Mom aware to call if desires assistance with next feeding. BGilliam, RN, IBCLC  Addendum- discussed optimal skin to skin and benefits. BGilliam, RN, IBCLC Maternal Data Formula Feeding for Exclusion: Yes Reason for exclusion:  (mom's choice to BF and supplement with formula) Has patient been taught Hand Expression?: Yes Does the patient have breastfeeding experience prior to this delivery?: No  Feeding Feeding Type: Breast Fed  LATCH Score Latch: Repeated  attempts needed to sustain latch, nipple held in mouth throughout feeding, stimulation needed to elicit sucking reflex.  Audible Swallowing: A few with stimulation  Type of Nipple: Everted at rest and after stimulation (everted with breast shells)  Comfort (Breast/Nipple): Soft / non-tender  Hold (Positioning): Assistance needed to correctly position infant at breast and maintain latch.  LATCH Score: 7  Interventions Interventions: Breast feeding basics reviewed;Assisted with latch;Skin to skin;Breast massage;Hand express;Breast compression;Adjust position;Support pillows;Position options  Lactation Tools Discussed/Used WIC Program: No   Consult Status Consult Status: Follow-up Date: 02/12/20 Follow-up type: In-patient    Bernita Buffy 02/12/2020, 5:08 PM

## 2020-02-12 NOTE — Progress Notes (Signed)
Post Partum Day 1 Subjective: no complaints, up ad lib, voiding, tolerating PO and nl lochia, pain controled.  Tolerating anemia from blood loss.    Objective: Blood pressure 128/74, pulse (!) 110, temperature 98.4 F (36.9 C), resp. rate 17, height 5\' 6"  (1.676 m), weight 119.3 kg, SpO2 99 %, unknown if currently breastfeeding.  Physical Exam:  General: alert and no distress Lochia: appropriate Uterine Fundus: firm   Recent Labs    02/12/20 0019 02/12/20 0357  HGB 11.2* 9.8*  HCT 33.6* 29.5*    Assessment/Plan: Plan for discharge tomorrow, Breastfeeding and Lactation consult,  Routine PP care.     LOS: 2 days   Jamie Daniel 02/12/2020, 9:41 AM

## 2020-02-12 NOTE — Lactation Note (Signed)
This note was copied from a baby's chart. Lactation Consultation Note  Patient Name: Jamie Daniel Date: 02/12/2020   Initial visit attempted at 15 hours of life. When I entered room, I noted that Mom was sleeping, but Dad immediately asked me if it was ok that infant had not yet voided or stooled. I reassured Dad & informed him that we just need 1 void & 1 stool before infant is 33 hrs old.   Dad has been bottle-feeding formula as his wife rests. I updated the feeding chart & left the lactation brochure at the bedside. Dad reports that infant has been doing well with the slow-flow nipple.    Matthias [facility] 02/12/2020, 12:13 PM

## 2020-02-12 NOTE — Progress Notes (Signed)
Parent request formula to supplement breast feeding due to mother's fatigue/exhaustion. Mother states that her plan is to breast feed and that she would like to see lactation during the day but does not want to attempt to latch at this time due to exhaustion after hemorrage.  Parents have been informed of small tummy size of newborn, taught hand expression and understand the possible consequences of formula to the health of the infant. The possible consequences shared with patient include 1) Loss of confidence in breastfeeding 2) Engorgement 3) Allergic sensitization of baby(asthma/allergies) and 4) decreased milk supply for mother. After discussion of the above, parents were educated on amount of formula to feed baby and given bottle per request. Supplementation tools were explained and parents are requesting to use slow flow nipple at this time.

## 2020-02-12 NOTE — Lactation Note (Signed)
This note was copied from a baby's chart. Lactation Consultation Note  Patient Name: Jamie Daniel [id number] Date: 02/12/2020 Reason for consult: Follow-up assessment In to see mom to set up DEBP and assess BF.  Mom sitting in bed, baby swaddled in bassinet asleep. Mom has not BF since last [place] visit, requests help with latching.  Mom re-fitted with 12mm NS.  Mom latched baby to left breast football hold with assistance, reports noting tugs at the breast, audible swallows noted, baby fell asleep at breast ~102min mark. Set mom up with DEBP, reviewed setup, frequency and cleaning. Reinforced cluster feeding, expected volume from pump (offer EBM back to baby), advised continue with supplementation plan per volume on guide. Left the room with mom pumping, dad at bedside eating, baby at bedside awake and alert. BGilliam, RN, IBCLC  Maternal Data    Feeding Feeding Type: Breast Fed  LATCH Score Latch: Grasps breast easily, tongue down, lips flanged, rhythmical sucking.  Audible Swallowing: A few with stimulation  Type of Nipple: Everted at rest and after stimulation  Comfort (Breast/Nipple): Soft / non-tender  Hold (Positioning): Assistance needed to correctly position infant at breast and maintain latch.  LATCH Score: 8  Interventions Interventions: Assisted with latch;DEBP;Support pillows;Adjust position  Lactation Tools Discussed/Used Pump Review: Setup, frequency, and cleaning Initiated by:: Rex Kras, RN, IBCLC Date initiated:: 02/12/20   Consult Status Consult Status: Follow-up Date: 02/13/20 Follow-up type: In-patient    Jamie Daniel 02/12/2020, 8:45 PM

## 2020-02-13 MED ORDER — IBUPROFEN 600 MG PO TABS: 600.0000 mg | ORAL_TABLET | Freq: Four times a day (QID) | ORAL | 1 refills | Status: AC

## 2020-02-13 MED ORDER — PRENATAL MULTIVITAMIN CH: 1.0000 | ORAL_TABLET | Freq: Every day | ORAL | 3 refills | Status: AC

## 2020-02-13 NOTE — Discharge Summary (Signed)
Postpartum Discharge Summary      Patient Name: Jamie Daniel DOB: [birth year] MRN: [id number]  Date of admission: 02/10/2020 Delivery date:02/11/2020  Delivering provider: Janyth Contes  Date of discharge: 02/13/2020  Admitting diagnosis: Gestational hypertension [O13.9] Intrauterine pregnancy: [redacted]w[redacted]d    Secondary diagnosis:  Principal Problem:   SVD (spontaneous vaginal delivery) Active Problems:   Gestational hypertension  Additional problems: PPH    Discharge diagnosis: Term Pregnancy Delivered                                              Post partum procedures:N/A (baby O neg) Augmentation: AROM and Pitocin Complications: None  Hospital course: Induction of Labor With Vaginal Delivery   34y.o. yo G2P1011 at 351w4das admitted to the hospital 02/10/2020 for induction of labor.  Indication for induction: Gestational hypertension.  Patient had an uncomplicated labor course as follows: Membrane Rupture Time/Date: 9:42 PM ,02/10/2020   Delivery Method:Vaginal, Spontaneous  Episiotomy: None  Lacerations:  2nd degree  Details of delivery can be found in separate delivery note.  Patient had a routine postpartum course. Patient is discharged home 02/13/20.  Newborn Data: Birth date:02/11/2020  Birth time:8:30 PM  Gender:Female  Living status:Living  Apgars:8 ,9  Weight:3960 g   Magnesium Sulfate received: No BMZ received: No Rhophylac:No MMR:No T-DaP:Given prenatally Flu: No Transfusion:No  Physical exam  Vitals:   02/12/20 1300 02/12/20 1718 02/12/20 2150 02/13/20 0552  BP: 98/66 120/76 124/65 123/71  Pulse: 92 83 70 73  Resp: '18 18 18 18  '$ Temp: 98.1 F (36.7 C) 98 F (36.7 C) 98 F (36.7 C) 98.1 F (36.7 C)  TempSrc:  Oral Oral   SpO2: 95% 97%    Weight:      Height:       General: alert and no distress Lochia: appropriate Uterine Fundus: firm  Labs: Lab Results  Component Value Date   WBC 16.8 (H) 02/12/2020   HGB 9.8 (L) 02/12/2020   HCT  29.5 (L) 02/12/2020   MCV 93.4 02/12/2020   PLT 175 02/12/2020   CMP Latest Ref Rng & Units 02/10/2020  Glucose 70 - 99 mg/dL 124(H)  BUN 6 - 20 mg/dL 7  Creatinine 0.44 - 1.00 mg/dL 0.78  Sodium 135 - 145 mmol/L 135  Potassium 3.5 - 5.1 mmol/L 4.0  Chloride 98 - 111 mmol/L 103  CO2 22 - 32 mmol/L 22  Calcium 8.9 - 10.3 mg/dL 9.5  Total Protein 6.5 - 8.1 g/dL 6.5  Total Bilirubin 0.3 - 1.2 mg/dL 0.4  Alkaline Phos 38 - 126 U/L 151(H)  AST 15 - 41 U/L 20  ALT 0 - 44 U/L 21   Edinburgh Score: Edinburgh Postnatal Depression Scale Screening Tool 02/12/2020  I have been able to laugh and see the funny side of things. (No Data)      After visit meds:  Allergies as of 02/13/2020   No Known Allergies     Medication List    STOP taking these medications   multivitamin with minerals tablet     TAKE these medications   ibuprofen 600 MG tablet Commonly known as: ADVIL Take 1 tablet (600 mg total) by mouth every 6 (six) hours.   prenatal multivitamin Tabs tablet Take 1 tablet by mouth daily at 12 noon.        Discharge home in  stable condition Infant Feeding: Bottle and Breast Infant Disposition:home with mother Discharge instruction: per After Visit Summary and Postpartum booklet. Activity: Advance as tolerated. Pelvic rest for 6 weeks.  Diet: routine diet Anticipated Birth Control: Unsure Postpartum Appointment:6 weeks Additional Postpartum F/U: BP check 1 week Future Appointments:No future appointments. Follow up Visit:  Follow-up Information    Bovard-Stuckert, Sender Rueb, MD. Schedule an appointment as soon as possible for a visit in 1 week(s).   Specialty: Obstetrics and Gynecology Why: for Blood pressure check and 6 weeks for full postpartum exam Contact information: [address] [address] [place] [address] [phone]                   02/13/2020 Janyth Contes, MD

## 2020-02-13 NOTE — Progress Notes (Addendum)
Post Partum Day 2 Subjective: no complaints, up ad lib, voiding, tolerating PO and nl lochia,pain controlled.   Tolerating anemia.  Working on breastfeeding.  Advised increasing po intake.    Objective: Blood pressure 123/71, pulse 73, temperature 98.1 F (36.7 C), resp. rate 18, height 5\' 6"  (1.676 m), weight 119.3 kg, SpO2 97 %, unknown if currently breastfeeding.  Physical Exam:  General: alert and no distress Lochia: appropriate Uterine Fundus: firm   Recent Labs    02/12/20 0019 02/12/20 0357  HGB 11.2* 9.8*  HCT 33.6* 29.5*    Assessment/Plan: Discharge home, Breastfeeding and Lactation consult.  Routine PP care.  D/C with motrin and PNV.  F/u 6 weeks.     LOS: 3 days   Jamie Daniel 02/13/2020, 8:18 AM

## 2020-02-13 NOTE — Lactation Note (Signed)
This note was copied from a baby's chart. Lactation Consultation Note  Patient Name: Jamie Daniel TKPTW'S Date: 02/13/2020 Reason for consult: Follow-up assessment;Primapara;1st time breastfeeding;Term;Infant weight loss;Other (Comment) (1 % weight loss / tongur mobility issues / see LC note) Baby is 70 hours old  Per dad fed last at 1245 30 ml and the baby was able to take it well from the formula.  Per mom has been pumping, but only drops so far that was finger feed back to the baby.  Mom has a 1800 ml EBL , and mom is aware her milk may come in slow due to the blood loss.  LC stressed the importance of consistent pumping whether the baby latches or not.  Mom was given a Nipple Shield previous to this [facility] consult and has been attempting to latch.  Per mom concerned the baby isn't getting enough from her and that she was told the baby has a tongue tie . Mom asked the LC to assess the baby mouth and tongue.  LC with gloved fingers assessed the baby's mouth and noted the upper lip to stretch well, and the skin notch under the upper lip was  just above the gum line,  The anterior lingual frenulum appears to be a short skin notch and baby able to only stretch  Tongue a short distance, but is borderline for short to tight.  Baby has no problem taking a bottle.  Sore nipple and engorgement prevention and tx reviewed.  Mom has been pumping with a DEBP and will have a DEBP at home.  Baby F/U is at [place] and [facility] recommended asking for [facility] consult with the NP/LC. If  Unavailable to call the [facility] LC O/P number for appt..  LC reassured mom to keep the consistent pumping 8-10 times a day for 15 - 20 mins.  Per mom is ok feeding the baby EBM from a bottle if needed.    Maternal Data Has patient been taught Hand Expression?:  (mom mentioned she has gotten drops pumping and finger feed back to baby)  Feeding Feeding Type:  (per dad last feeding was at 1245 - 30 ml)  LATCH  Score Interventions Interventions: Breast feeding basics reviewed  Lactation Tools Discussed/Used Tools: Pump;Shells;Flanges Nipple shield size: 24 (mom mentioned she is working on latching , concerned baby is not getting enough) Flange Size: 24;27 Shell Type: Inverted Breast pump type: Double-Electric Breast Pump   Consult Status Consult Status: Follow-up Date:  (mom will check with [facility] off NP / LC for LC appt - aware of the [facility] O.P service) Follow-up type: [place] 02/13/2020, 2:26 PM

## 2020-02-14 ENCOUNTER — Encounter (HOSPITAL_COMMUNITY): Payer: 59

## 2020-02-14 ENCOUNTER — Encounter (HOSPITAL_COMMUNITY): Payer: Self-pay

## 2020-03-08 ENCOUNTER — Telehealth (HOSPITAL_COMMUNITY): Payer: Self-pay | Admitting: Lactation Services

## 2020-03-08 NOTE — Telephone Encounter (Signed)
This mom called left a message, the [place] returned the call and left a message , mom called back and left another message , and LC was able to reach mom 2nd try.  Mom had questions regarding plugged ducts, engorgement on one breast and questionable mastitis.  Mom mentioned the plugged started yesterday and last night had a headache, and chills no temp.  Mom did mentioned she has only pumped and bottle fed since her baby was discharged to due to feeling so bad initially when she went home 3 weeks ago from a [place] and her milk was delayed coming in. She was pumping about 6 times a day and has continued that routine pumping with a DEBP Willow. In the last few days her friend let her use her Spectra DEBP and she was feeling she developed sore nipples due to larger flange and went back to her [place].  Per mom had called her doctor and they have called in a prescription for antibiotics preventive  and she plans to pick up.  Mom also mentioned her baby is sleeping 5 hours at night and when her baby wakes up she feeds a bottle and pumps off 4-5 oz off the left , 1- 1/2 oz off the right, ( right has always been behind with volume compared to left) for today per mom she usually is able to produce more off the right.  LC recommended the importance for now until this plug and the engorgement is relieved she needs to increased consistent pumping and when her breast are filling to release down not to wait. Mom mentioned she was only using heat not ice.  Per mom right breast firm to hard- LC recommended icing for 15 -20 mins - ( 30 mins max ) , then apply moist warm towel to enhance the let down and massage - hand express and pump both breast to release breast. Also can use the cold cabbage leaves sparely 2 times a day.  If that doesn't work to call [place] back and start the antibiotics the doctor prescribed.  Mom expressed appreciation for the return call.

## 2020-03-23 ENCOUNTER — Other Ambulatory Visit: Payer: Self-pay | Admitting: Obstetrics and Gynecology

## 2020-03-23 DIAGNOSIS — Z1501 Genetic susceptibility to malignant neoplasm of breast: Secondary | ICD-10-CM

## 2020-04-06 ENCOUNTER — Telehealth (HOSPITAL_COMMUNITY): Payer: Self-pay | Admitting: Lactation Services

## 2020-04-06 ENCOUNTER — Telehealth (HOSPITAL_COMMUNITY): Payer: Self-pay

## 2020-04-06 NOTE — Telephone Encounter (Signed)
Mom left message regarding a painful clogged duct and desiring to decrease her milk supply.  [place] returned call but there was no answer so a message was left.  Another call will be made later to try to reach pt. Again.

## 2020-04-06 NOTE — Telephone Encounter (Signed)
LC attempted again to reach mom after previously leaving msg.    Mom exclusively pumps and has had breast pain in side and lower portion of breast since last night.  She has increased her pumping to every 2 hours.  She uses a Willow pump but feels today she is getting out 1/2 of what she normally collects from the affected breast.She doesn't have a specified hard area; just a painful area.    [place] inquired about appearance of breasts:  Mom states it's warm, painful without touching and nipple has Jasper appearing tissue on the bottom which she removed and now nipple is bleeding.  LC inquired about appearance of a "pimple" or whitehead on the nipple but mom does not see what could be a clogged pore.  Mom is achy feeling and chilled.  Mom has already messaged her OB about a prescription of an antibiotic.  LC encouraged her to call the office and speak with the after hours RN to ensure a prescription gets called in.  She began taking Lecithin this morning and LC encouraged her to continue taking it.    Mom is not familiar with hand expression; LC referred her to videos on hand exp. By DR. Harless Nakayama and encouraged her to try to hand express after pumping to remove more milk from affected breast.  Hand expressing in a position where the breast can dangle and partner could apply very gently pressure to the affected area/breast to encourage milk removal    LC also recommended icing after pumping.  Mom has APNO with her and will apply that to the nipple area.  She was prescribed the ointment previously.    Mom has had a previous bout of Mastitis approx. 1.5 months ago but with a different breast.    LC answered all questions. Mom is taking ibuprofen and will continue to take it to help with inflammation and will be contacting her care provider.  Note will be given for lactation to follow up with mom tomorrow via phone.

## 2020-04-09 ENCOUNTER — Telehealth (HOSPITAL_COMMUNITY): Payer: Self-pay

## 2020-04-09 NOTE — Telephone Encounter (Signed)
Return phone call to [facility].  Tylea reports her left breast is feeling better but now its her right breast.  Mom reports that it is knotty and red and not getting much on that side when she pumps. Mom reports OB prescribed Doxycicline, Sudafed , and she is using cold cabbage leaves and neosporin now on her nipples.Mom reports also taking Lethicin. Mom reports she was still having chills yesterday and a fever.  Collaberated with other LC's in department.  Discussed using Spectra DEBP instead of Willow and using the 27 mm flanges.  Discussed not quitting to pump while she has mastitis.  Urged to completely empty breasts and then pump every three hours until comfortable.  Discussed after she is done with mastitis to pick times her breasts feel less full to eliminate that pumping. Discussed Reverse pressure softening and ice prior to pumping if breast so engorged milk is not coming.  And then heat and massage with pumping. Urged her to continue to check her breasts for red and hard areas and to follow up with OB on Monday. Urged to call lactation as needed.

## 2020-04-09 NOTE — Telephone Encounter (Signed)
Telephone call again from [facility].  Reports left breast still painful now with pumping and nipple red with blisters.Mom reports she popped the blisters. Mom reports breast red as well. Mom using APNO ointment on nipples now three weeks.  Mom reports they have still not healed and they are still sore.   Discussed that APNO has a steriod component and not usually recommended to use more than two three weeks.  Mom on vacation. Will not be back until Saturday and is upset and crying from pain.  Urged mom to follow up with OB tomorrow for antibiotic back up and other ways to decrease milk supply.  Mom reports her goal at this point is to wean from breastfeeding.  Mom reports had spaced pumping out to five to six hours .Urged her to continue to pump when she is too uncomfortable and probably about every three hours.Urged her to pay attention to her breasts,   Gentle massage. Heat with pumping. Call lactation as needed.

## 2020-04-12 ENCOUNTER — Other Ambulatory Visit: Payer: Self-pay | Admitting: Obstetrics and Gynecology

## 2020-04-12 DIAGNOSIS — O9123 Nonpurulent mastitis associated with lactation: Secondary | ICD-10-CM

## 2020-04-18 ENCOUNTER — Ambulatory Visit
Admission: RE | Admit: 2020-04-18 | Discharge: 2020-04-18 | Disposition: A | Discharge status: Home / Self Care | Payer: 59 | Source: Ambulatory Visit | Attending: Obstetrics and Gynecology | Admitting: Obstetrics and Gynecology

## 2020-04-18 ENCOUNTER — Other Ambulatory Visit: Payer: Self-pay

## 2020-04-18 DIAGNOSIS — O9123 Nonpurulent mastitis associated with lactation: Secondary | ICD-10-CM

## 2020-04-18 IMAGING — US US BREAST*R* LIMITED INC AXILLA
1 series · 11 of 11 positions shown · non-contrast
Comparison: Previous exam(s).

CLINICAL DATA: 34-year-old female who is 9 weeks postpartum and
recovering from two episodes of right breast mastitis. The patient's
doctor feels a lump in the superior right breast. BRCA 2 positive.

EXAM:
DIGITAL DIAGNOSTIC BILATERAL MAMMOGRAM WITH CAD AND TOMO
ULTRASOUND RIGHT BREAST

[Series 1: us breast*right* limited inc axilla · 0.07mm/px · 11 of 11 slices shown]
[im 1/11]
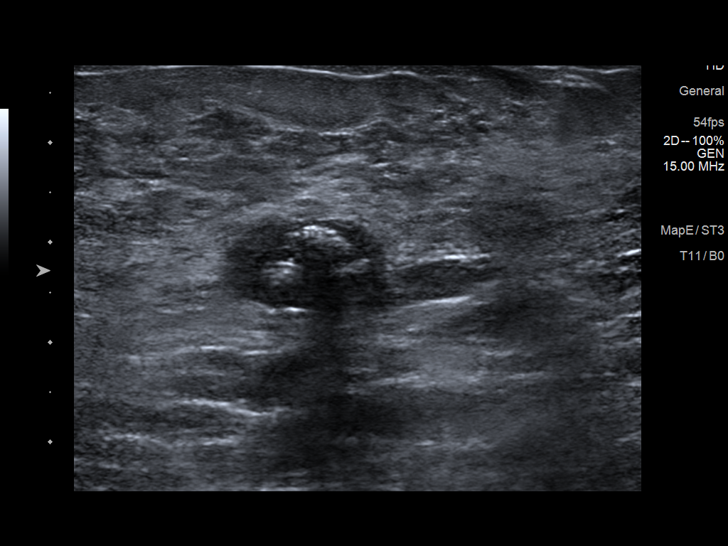
[im 2/11]
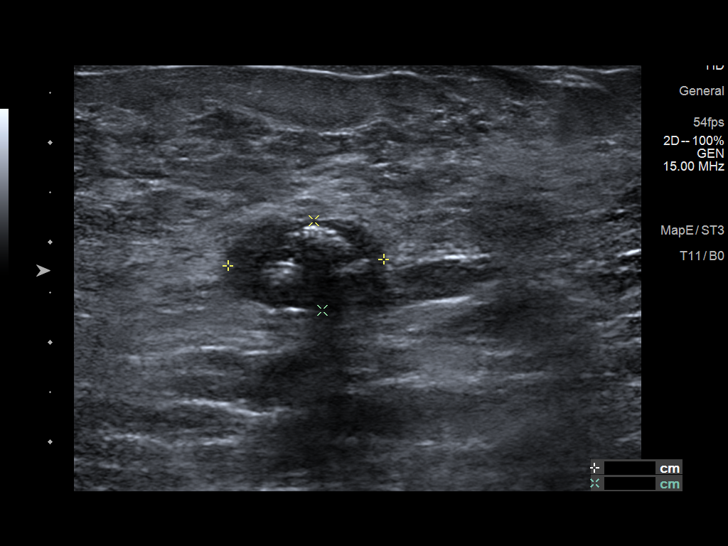
[im 3/11]
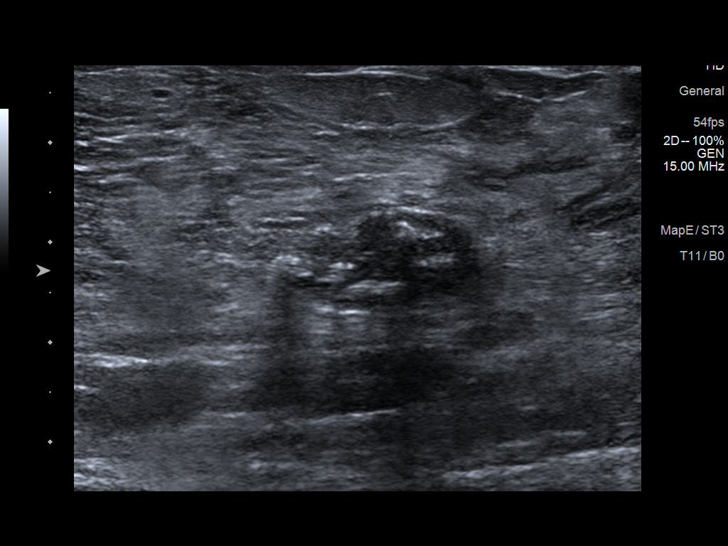
[im 4/11]
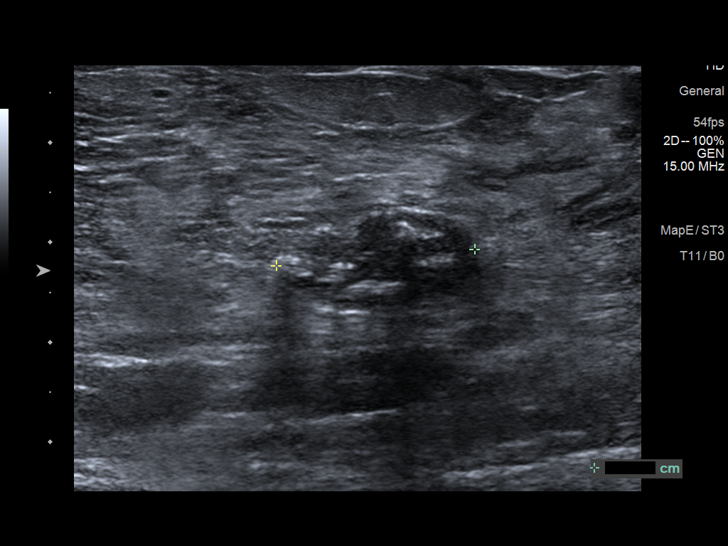
[im 5/11]
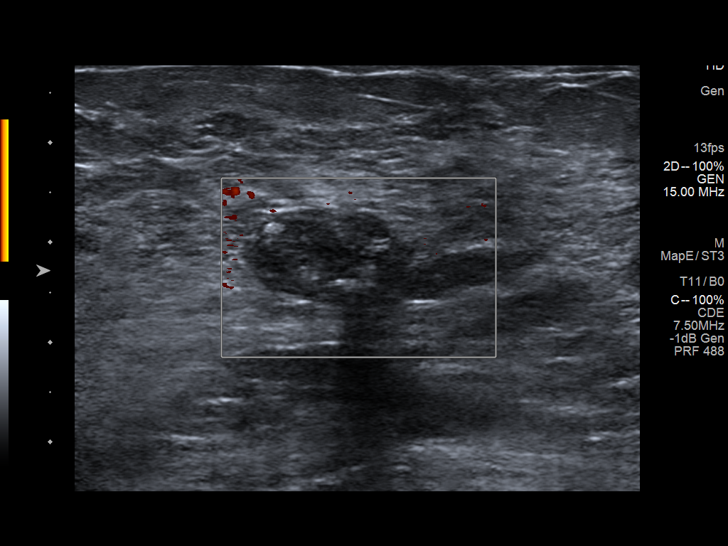
[im 6/11]
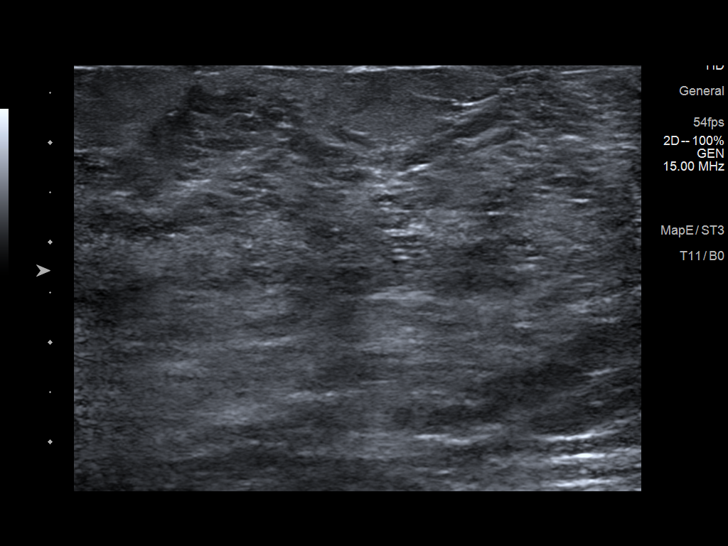
[im 7/11]
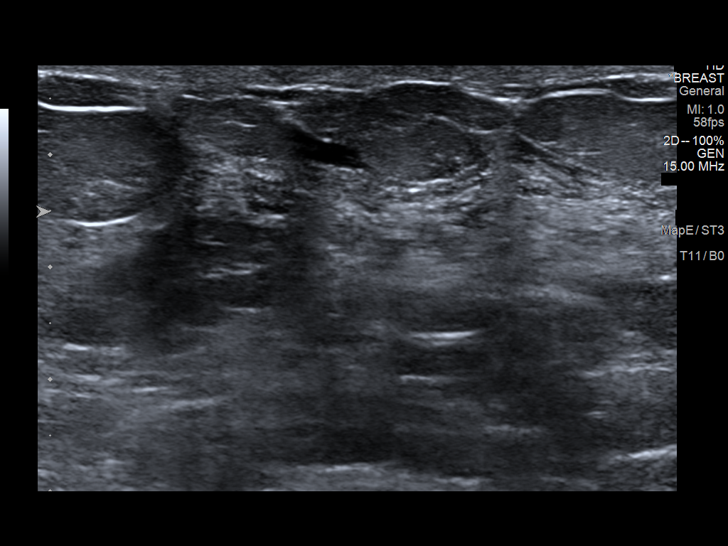
[im 8/11]
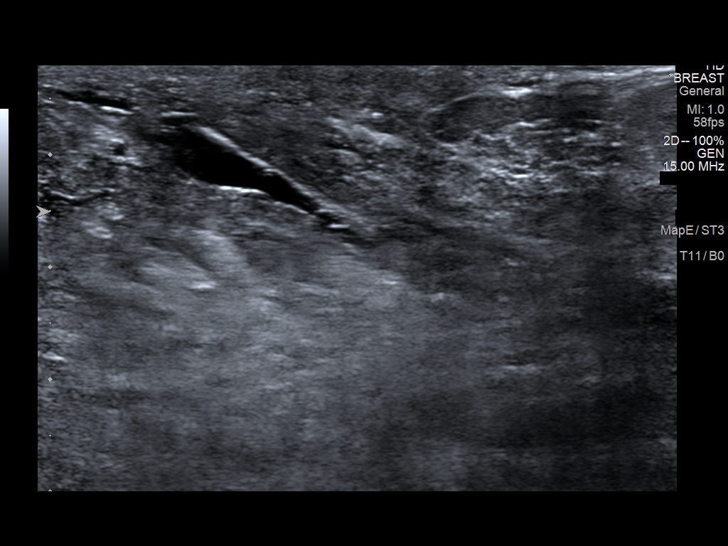
[im 9/11]
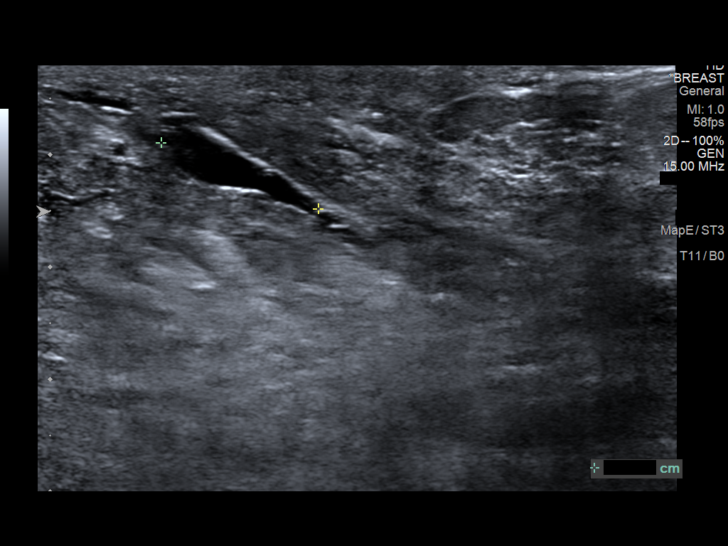
[im 10/11]
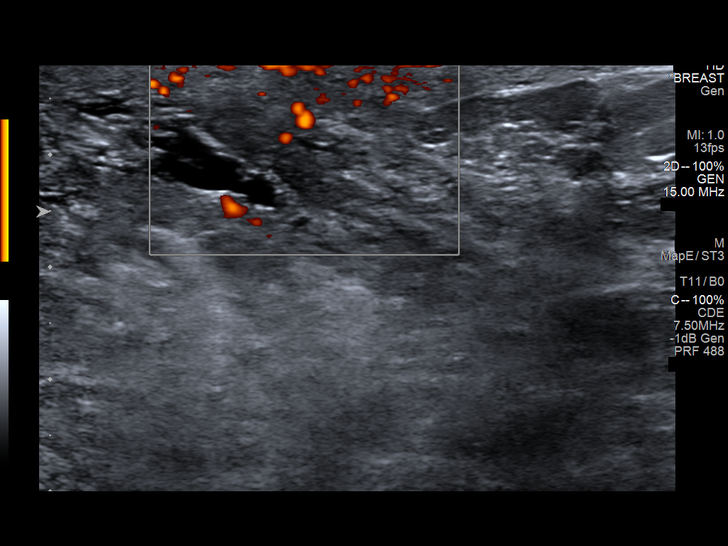
[im 11/11]
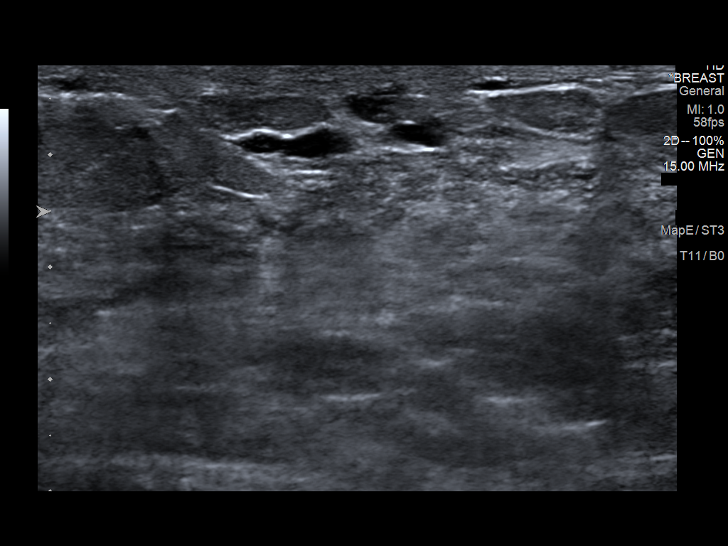

[11 of 11 positions shown; findings below may reference images not displayed]

ACR Breast Density Category d: The breast tissue is extremely dense,
which lowers the sensitivity of mammography.
FINDINGS: Mammogram:

Right breast: A skin BB marks the palpable site of concern felt by
the patient's doctor in the superior right breast. Spot compression
tomosynthesis views were performed in addition to standard views.
There is again an obscured mass with coarse calcifications. The
calcifications have increased compared to the prior mammogram.
Additionally in the slightly inferior central right breast there is
an oval mass measuring approximately 0.9 cm.

Left breast: No suspicious mass, distortion, or microcalcifications
are identified to suggest presence of malignancy.

Mammographic images were processed with CAD.

On physical exam, I feel a small mass at the site of concern in the
superior right breast.

Ultrasound:

Targeted ultrasound is performed at the palpable site of concern at
12 o'clock 2 cm from the nipple demonstrating an oval circumscribed
hypoechoic mass measuring 1.6 x 0.9 x 2.0 cm. There are multiple
internal echogenic foci consistent with calcification. This
corresponds to the mass seen mammographically. This mass was
previously evaluated in [DATE] at which time it measured
x 0.9 x 2.3 cm. This is most consistent with an involuting
fibroadenoma.

Targeted ultrasound performed in the inferior central right breast
demonstrates several mildly dilated ducts with a focally dilated
duct at 5 o'clock 2 cm from the nipple which most likely corresponds
to the mass seen mammographically. There is no intraductal solid
component.
IMPRESSION: 1. At the palpable site of concern in the superior right breast at
12 o'clock there is a benign involuting fibroadenoma.

2. In the inferior right breast there are several benign mildly
dilated ducts.

3.  No mammographic evidence of malignancy bilaterally.

RECOMMENDATION:
Screening mammogram in one year.(Code:[FR]). Begin routine
annual screening mammography given increased risk of breast cancer.
Additionally patient should consider annual high risk screening
breast MRI.

I have discussed the findings and recommendations with the patient.
If applicable, a reminder letter will be sent to the patient
regarding the next appointment.

BI-RADS CATEGORY  2: Benign.

## 2020-04-18 IMAGING — MG DIGITAL DIAGNOSTIC BILAT W/ TOMO W/ CAD
6 of 10 series · 6 of 30 positions shown · non-contrast
Comparison: Previous exam(s).

CLINICAL DATA: 34-year-old female who is 9 weeks postpartum and
recovering from two episodes of right breast mastitis. The patient's
doctor feels a lump in the superior right breast. BRCA 2 positive.

EXAM:
DIGITAL DIAGNOSTIC BILATERAL MAMMOGRAM WITH CAD AND TOMO
ULTRASOUND RIGHT BREAST

[L CC synth-2D]
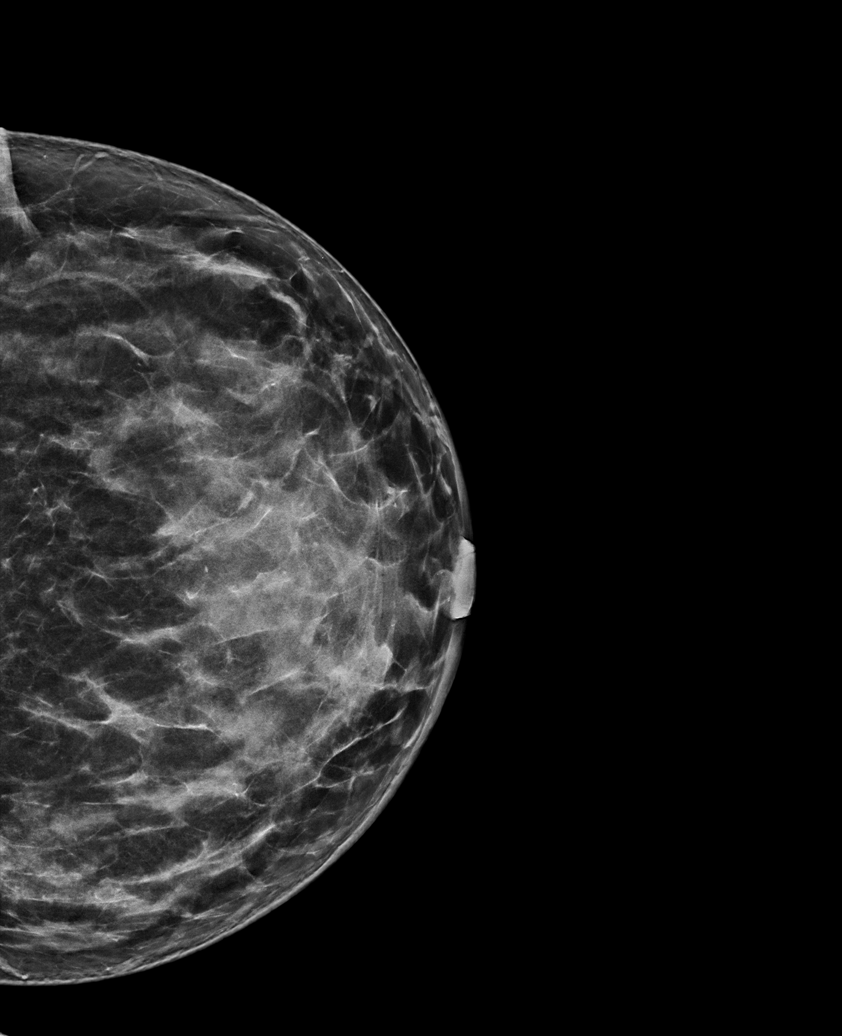

[R MLO synth-2D]
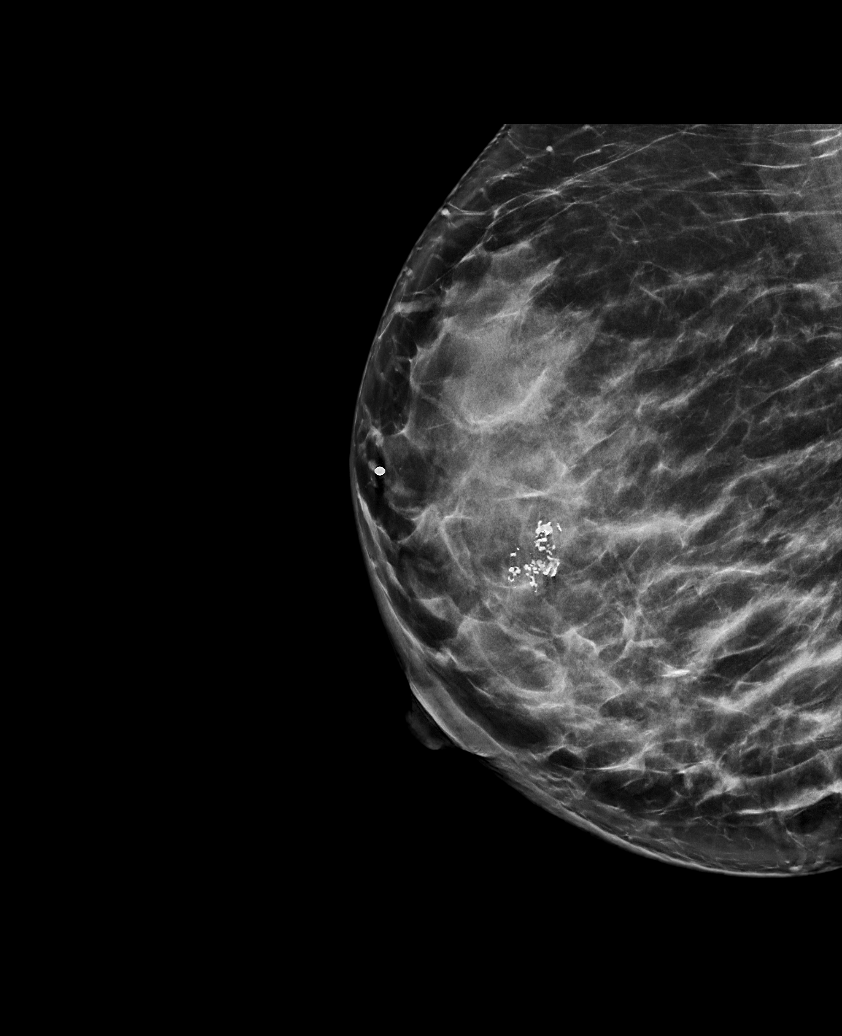

[L MLO synth-2D]
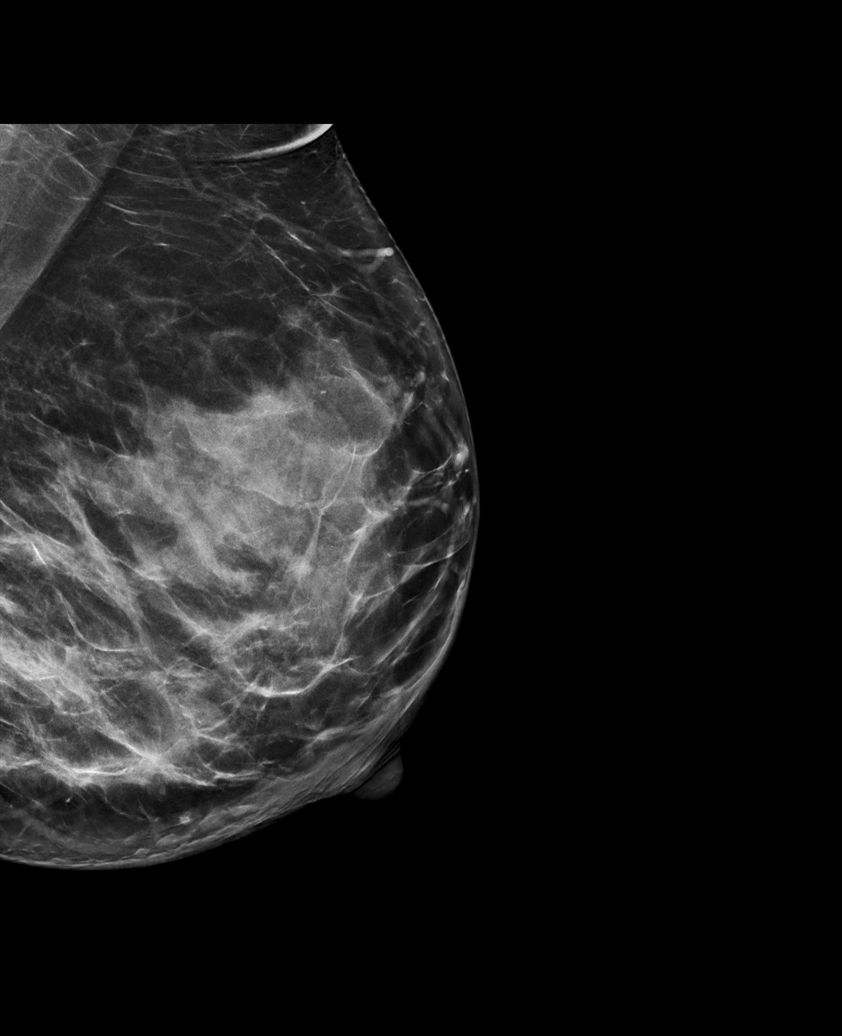

[R ML synth-2D]
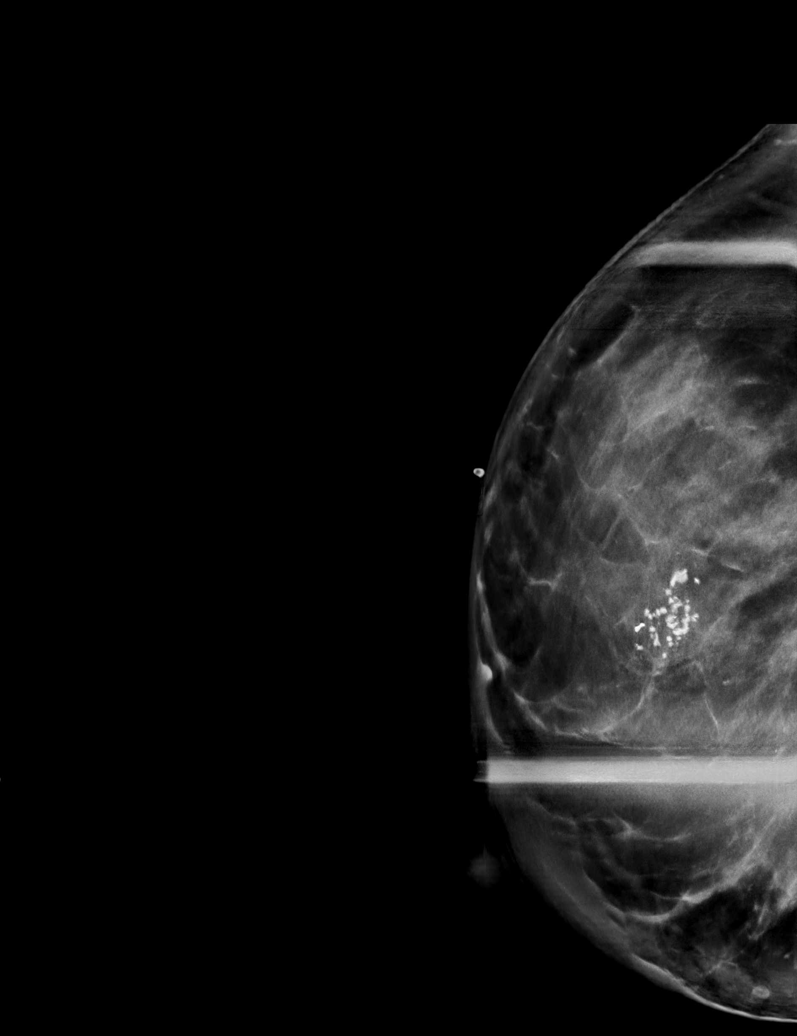

[R CC synth-2D]
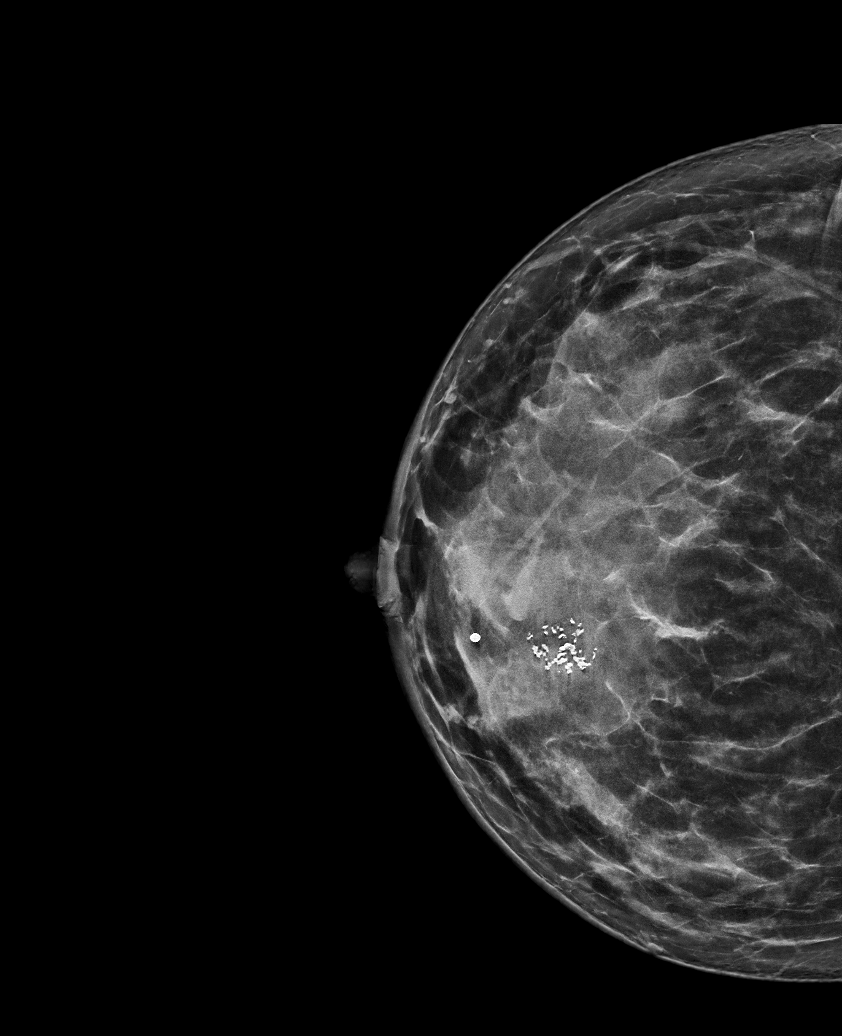

[R CC tomo · tomo slice 43/84.0]
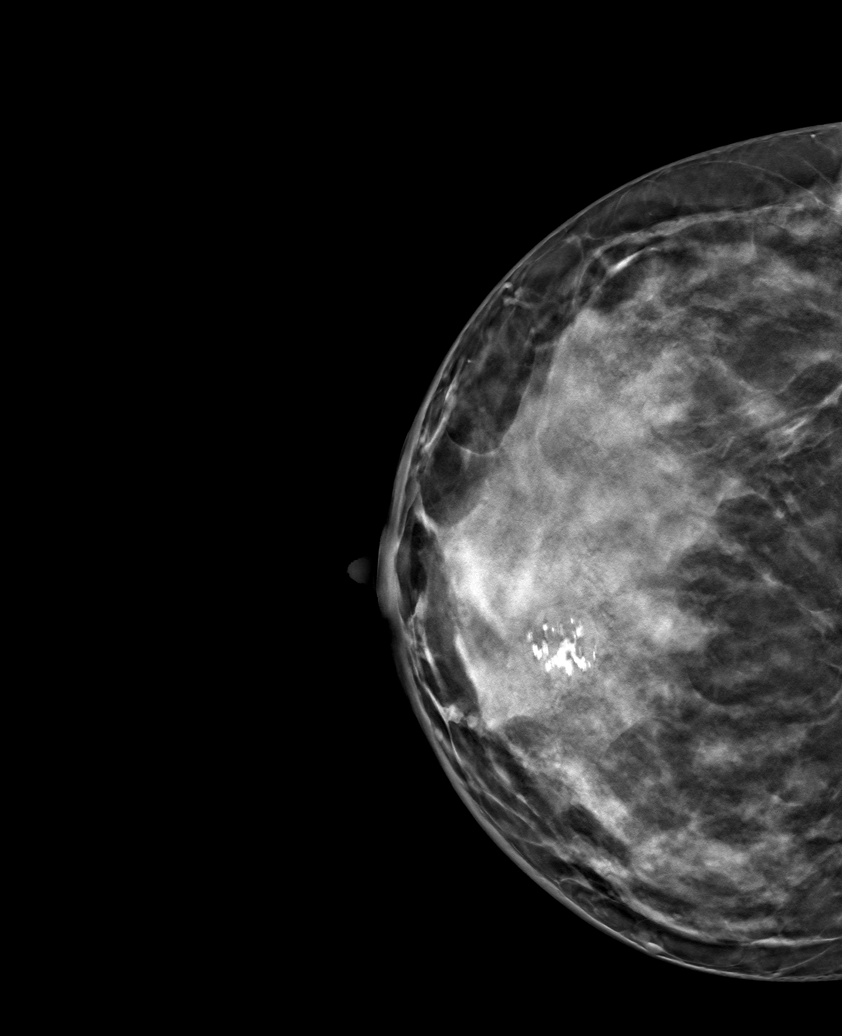

[6 of 30 positions shown; findings below may reference images not displayed]

ACR Breast Density Category d: The breast tissue is extremely dense,
which lowers the sensitivity of mammography.
FINDINGS: Mammogram:

Right breast: A skin BB marks the palpable site of concern felt by
the patient's doctor in the superior right breast. Spot compression
tomosynthesis views were performed in addition to standard views.
There is again an obscured mass with coarse calcifications. The
calcifications have increased compared to the prior mammogram.
Additionally in the slightly inferior central right breast there is
an oval mass measuring approximately 0.9 cm.

Left breast: No suspicious mass, distortion, or microcalcifications
are identified to suggest presence of malignancy.

Mammographic images were processed with CAD.

On physical exam, I feel a small mass at the site of concern in the
superior right breast.

Ultrasound:

Targeted ultrasound is performed at the palpable site of concern at
12 o'clock 2 cm from the nipple demonstrating an oval circumscribed
hypoechoic mass measuring 1.6 x 0.9 x 2.0 cm. There are multiple
internal echogenic foci consistent with calcification. This
corresponds to the mass seen mammographically. This mass was
previously evaluated in [DATE] at which time it measured
x 0.9 x 2.3 cm. This is most consistent with an involuting
fibroadenoma.

Targeted ultrasound performed in the inferior central right breast
demonstrates several mildly dilated ducts with a focally dilated
duct at 5 o'clock 2 cm from the nipple which most likely corresponds
to the mass seen mammographically. There is no intraductal solid
component.
IMPRESSION: 1. At the palpable site of concern in the superior right breast at
12 o'clock there is a benign involuting fibroadenoma.

2. In the inferior right breast there are several benign mildly
dilated ducts.

3.  No mammographic evidence of malignancy bilaterally.

RECOMMENDATION:
Screening mammogram in one year.(Code:[FR]). Begin routine
annual screening mammography given increased risk of breast cancer.
Additionally patient should consider annual high risk screening
breast MRI.

I have discussed the findings and recommendations with the patient.
If applicable, a reminder letter will be sent to the patient
regarding the next appointment.

BI-RADS CATEGORY  2: Benign.

## 2020-10-25 ENCOUNTER — Other Ambulatory Visit: Payer: Self-pay

## 2020-10-25 ENCOUNTER — Ambulatory Visit
Admission: RE | Admit: 2020-10-25 | Discharge: 2020-10-25 | Disposition: A | Discharge status: Home / Self Care | Payer: 59 | Source: Ambulatory Visit | Attending: Obstetrics and Gynecology | Admitting: Obstetrics and Gynecology

## 2020-10-25 DIAGNOSIS — Z1501 Genetic susceptibility to malignant neoplasm of breast: Secondary | ICD-10-CM

## 2020-10-25 IMAGING — MR MR BREAST BILAT WO/W CM
8 of 12 series · 33 of 48 positions shown · IV contrast (10 ml Gadavist)
Comparison: Previous exam(s).

CLINICAL DATA: Patient with elevated lifetime risk breast cancer.
BRCA 2 positive.

EXAM:
BILATERAL BREAST MRI WITH AND WITHOUT CONTRAST
TECHNIQUE: Multiplanar, multisequence MR images of both breasts were obtained
prior to and following the intravenous administration of ml of
Gadavist

[Series 3: t2_tirm_tra ipat (a-p) · axial · 3.0mm · 0.78mm/px · 1 of 63 slices shown]
[im 1/63]
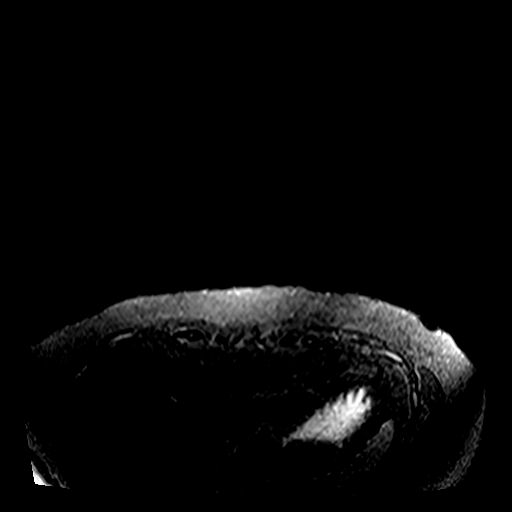

[Series 4: fl3d pre-cm no · axial · non-contrast · 1.2mm · 1.04mm/px · z∈[-80,+130]mm · 5 of 176 slices shown]
[im 1/176]
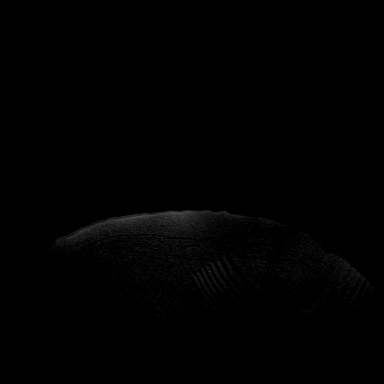
[im 44/176]
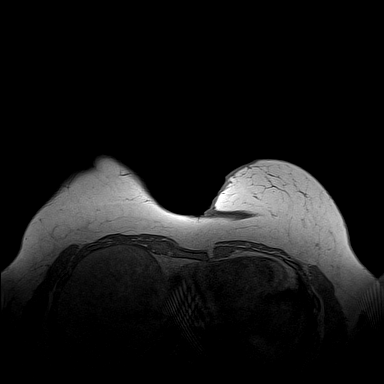
[im 88/176]
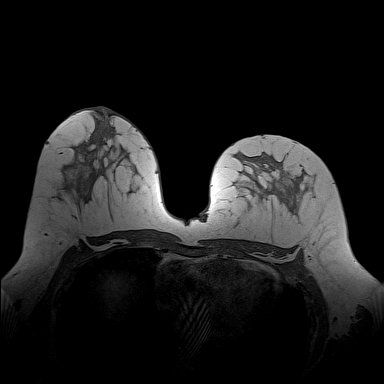
[im 132/176]
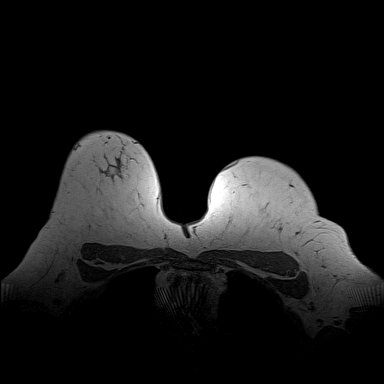
[im 176/176]
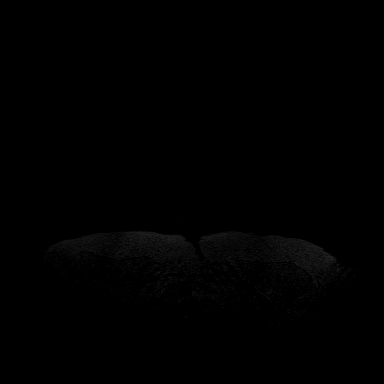

[Series 5: fl3d pre-cm · axial · non-contrast · 1.2mm · 1.04mm/px · z∈[-80,+130]mm · 5 of 176 slices shown]
[im 1/176]
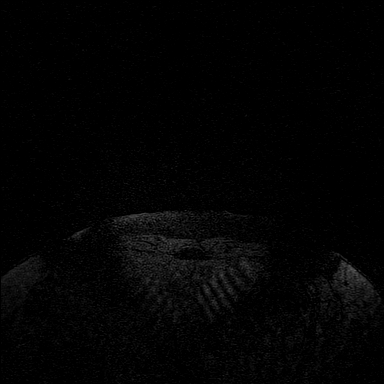
[im 44/176]
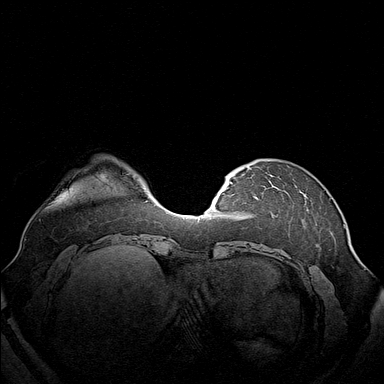
[im 88/176]
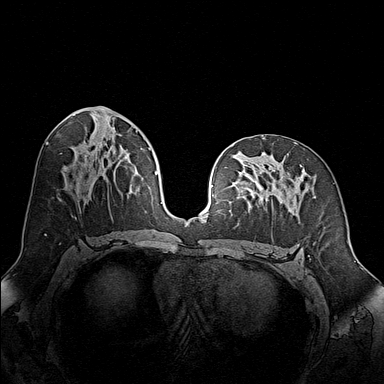
[im 132/176]
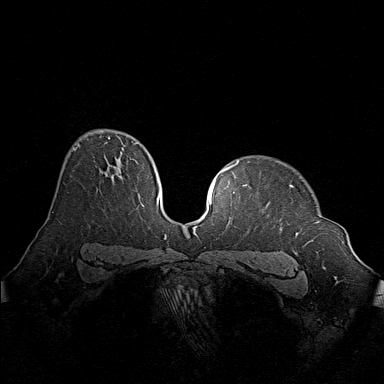
[im 176/176]
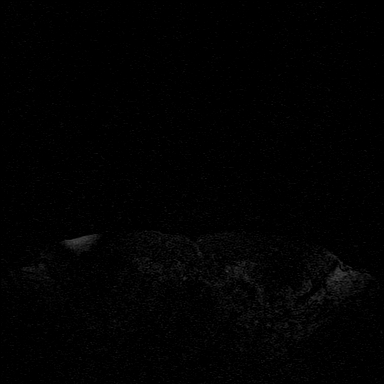

[Series 6: fl3d post-cm 20 · axial · 1.2mm · 1.04mm/px · z∈[-80,+130]mm · 5 of 176 slices shown (1 of 3)]
[im 1/176]
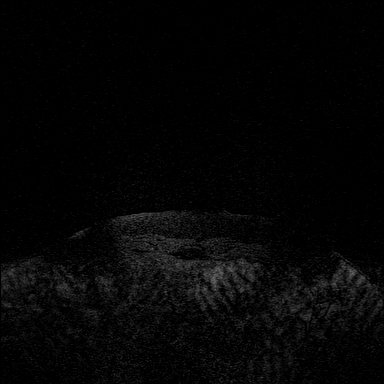
[im 44/176]
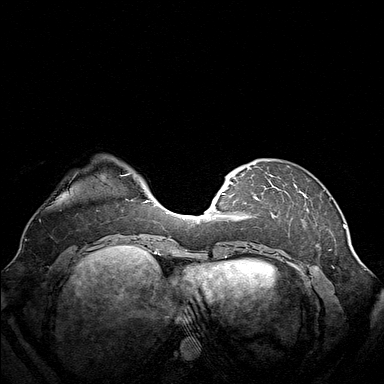
[im 88/176]
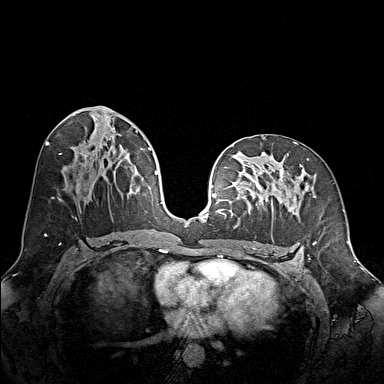
[im 132/176]
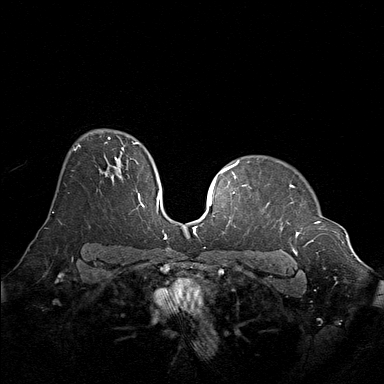
[im 176/176]
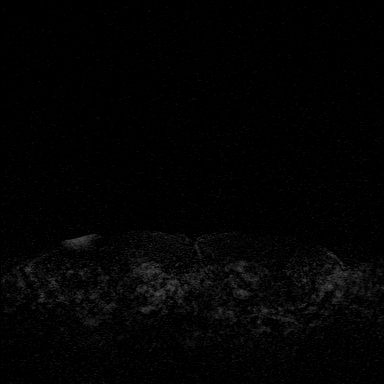

[Series 7: fl3d post-cm 20 · axial · 1.2mm · 1.04mm/px · z∈[-80,+130]mm · 5 of 176 slices shown (2 of 3)]
[im 1/176]
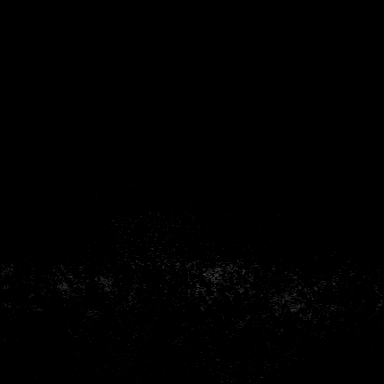
[im 44/176]
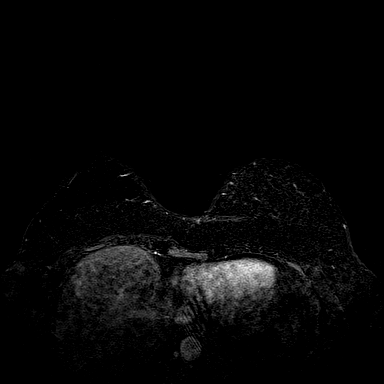
[im 88/176]
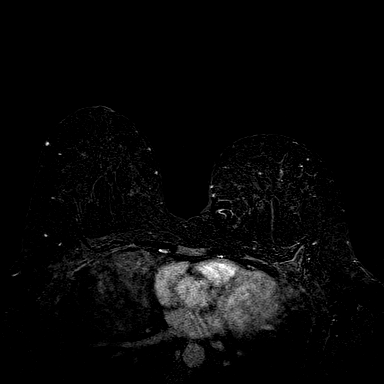
[im 132/176]
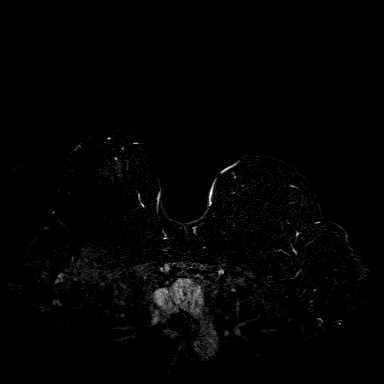
[im 176/176]
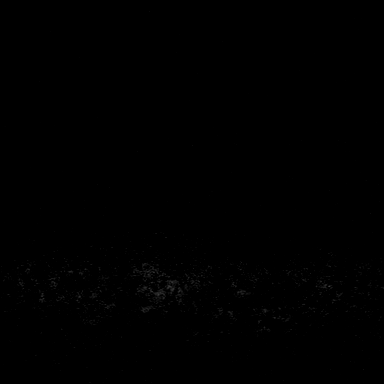

[Series 8: fl3d post-cm 20 · axial · 211.2mm · 1.04mm/px · 1 of 1 slices shown (3 of 3)]
[im 1/1]
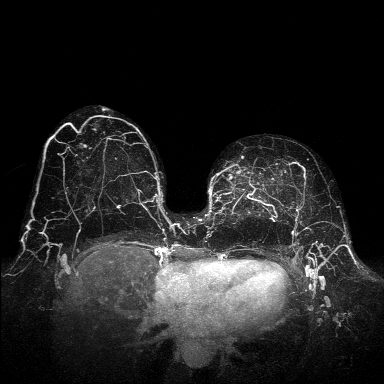

[Series 9: fl3d post-cm 3min · axial · 1.2mm · 1.04mm/px · z∈[-80,+130]mm · 6 of 176 slices shown]
[im 1/176]
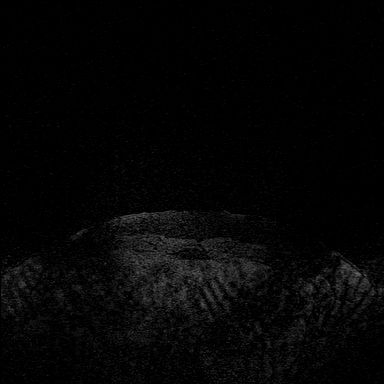
[im 36/176]
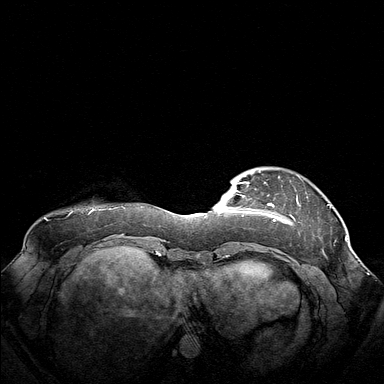
[im 71/176]
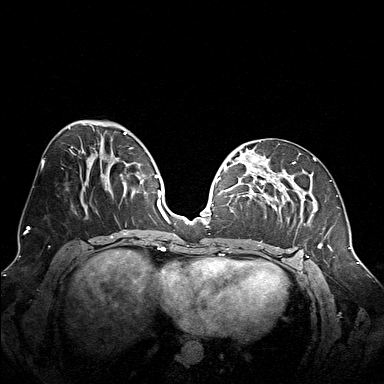
[im 106/176]
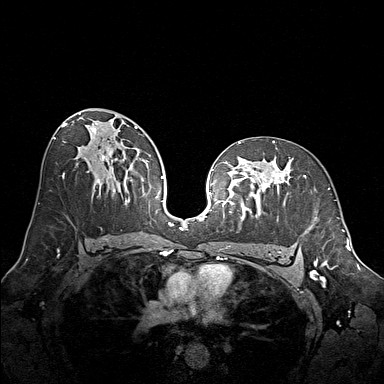
[im 141/176]
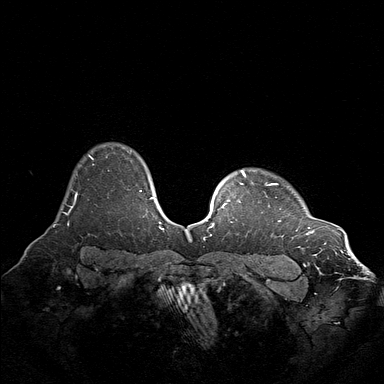
[im 176/176]
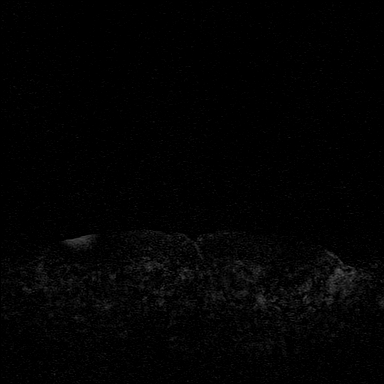

[Series 10: fl3d post-cm 3min_sub · axial · 1.2mm · 1.04mm/px · z∈[-80,+88]mm · 5 of 176 slices shown]
[im 1/176]
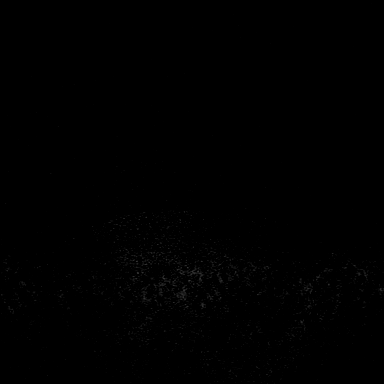
[im 36/176]
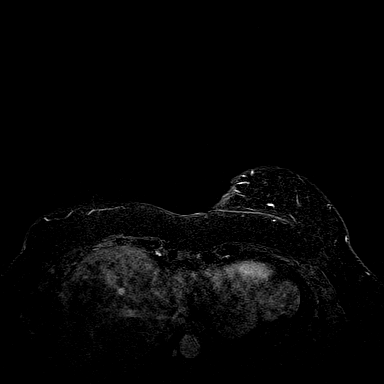
[im 71/176]
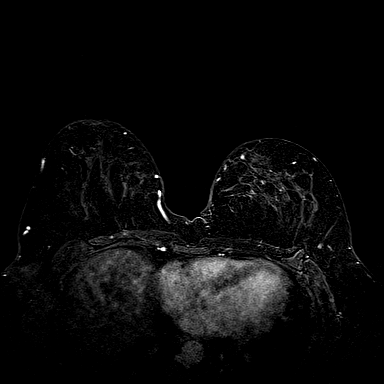
[im 106/176]
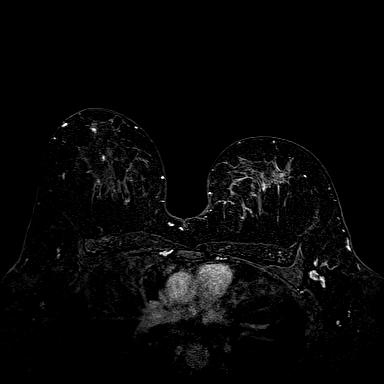
[im 141/176]
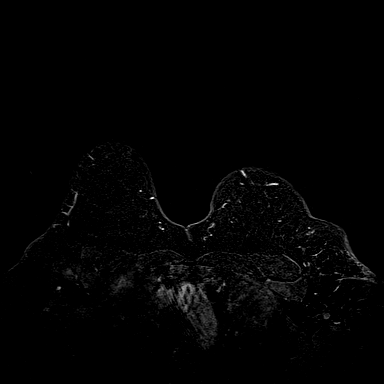

[33 of 48 positions shown; findings below may reference images not displayed]

Three-dimensional MR images were rendered by post-processing of the
original MR data on an independent workstation. The
three-dimensional MR images were interpreted, and findings are
reported in the following complete MRI report for this study. Three
dimensional images were evaluated at the independent interpreting
workstation using the DynaCAD thin client.
FINDINGS: Breast composition: c. Heterogeneous fibroglandular tissue.

Background parenchymal enhancement: Mild

Right breast: No mass or abnormal enhancement. Scattered foci
demonstrated throughout the right breast.

Left breast: Within the upper inner left breast middle depth there
is a 7 x 7 mm enhancing mass (image 77; series 13). Additional
scattered foci of enhancement are demonstrated throughout the left
breast.

Lymph nodes: No abnormal appearing lymph nodes.

Ancillary findings:  None.
IMPRESSION: Indeterminate enhancing mass upper inner left breast.

RECOMMENDATION:
MRI guided core needle biopsy indeterminate enhancing mass upper
inner left breast.

BI-RADS CATEGORY  4: Suspicious.

## 2020-10-25 MED ORDER — GADOBUTROL 1 MMOL/ML IV SOLN
10.0000 mL | Freq: Once | INTRAVENOUS | Status: AC | PRN
  Administered 2020-10-25: 10 mL via INTRAVENOUS

## 2020-10-27 ENCOUNTER — Other Ambulatory Visit: Payer: Self-pay | Admitting: Obstetrics and Gynecology

## 2020-10-27 DIAGNOSIS — R9389 Abnormal findings on diagnostic imaging of other specified body structures: Secondary | ICD-10-CM

## 2020-11-03 ENCOUNTER — Other Ambulatory Visit: Payer: Self-pay | Admitting: Diagnostic Radiology

## 2020-11-03 ENCOUNTER — Ambulatory Visit
Admission: RE | Admit: 2020-11-03 | Discharge: 2020-11-03 | Disposition: A | Discharge status: Home / Self Care | Payer: 59 | Source: Ambulatory Visit | Attending: Obstetrics and Gynecology | Admitting: Obstetrics and Gynecology

## 2020-11-03 ENCOUNTER — Other Ambulatory Visit: Payer: Self-pay

## 2020-11-03 DIAGNOSIS — R9389 Abnormal findings on diagnostic imaging of other specified body structures: Secondary | ICD-10-CM

## 2020-11-03 HISTORY — PX: BREAST BIOPSY: SHX20

## 2020-11-03 IMAGING — MR MR BREAST BX W LOC DEV 1ST LESION IMAGE BX SPEC MR GUIDE*L*
8 of 10 series · 34 of 48 positions shown · IV contrast (10 ml gadavist)
Comparison: Previous exams.
COMPARISON: Previous exams.

Addendum:
CLINICAL DATA: 34-year-old BRCA 2 positive female with an
indeterminate left breast mass.

EXAM:
MRI GUIDED CORE NEEDLE BIOPSY OF THE LEFT BREAST
TECHNIQUE: Multiplanar, multisequence MR imaging of the left breast was
performed both before and after administration of intravenous
contrast.
CONTRAST:  10mL GADAVIST GADOBUTROL 1 MMOL/ML IV SOLN

[Series 2: fiducial unilateral · sagittal · 2.0mm · 1.33mm/px · 3 of 52 slices shown]
[im 1/52]
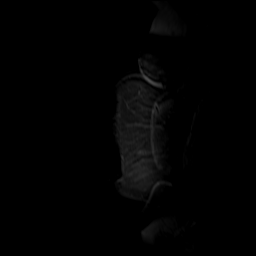
[im 26/52]
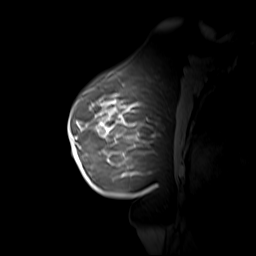
[im 52/52]
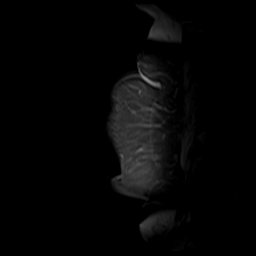

[Series 3: dynamic pre · axial · non-contrast · 1.3mm · 0.73mm/px · z∈[-95,+133]mm · 5 of 176 slices shown]
[im 1/176]
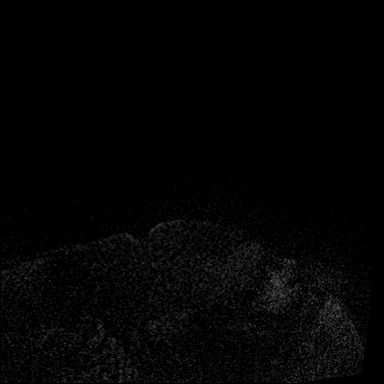
[im 44/176]
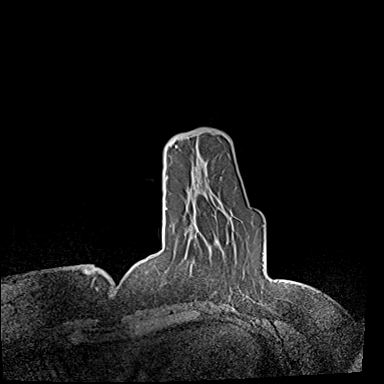
[im 88/176]
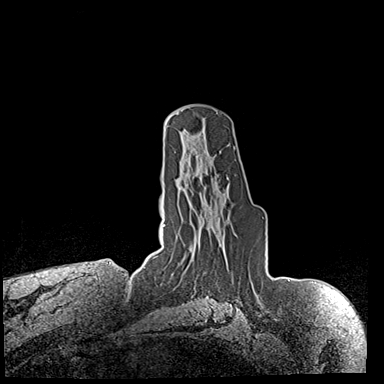
[im 132/176]
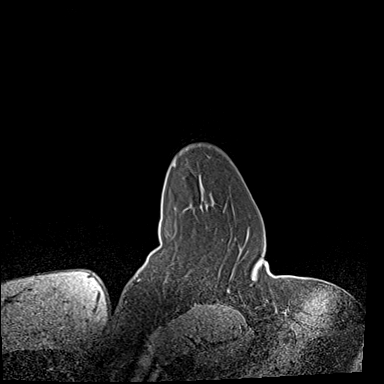
[im 176/176]
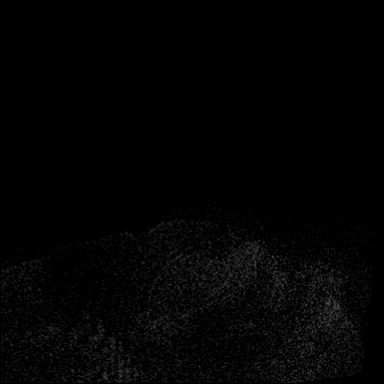

[Series 4: dynamic post 20 · axial · 1.3mm · 0.73mm/px · z∈[-95,+133]mm · 5 of 176 slices shown (1 of 2)]
[im 1/176]
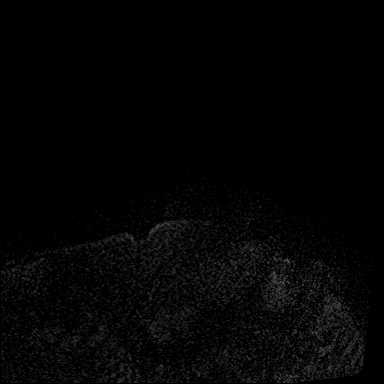
[im 44/176]
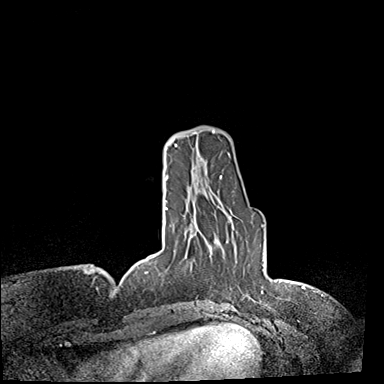
[im 88/176]
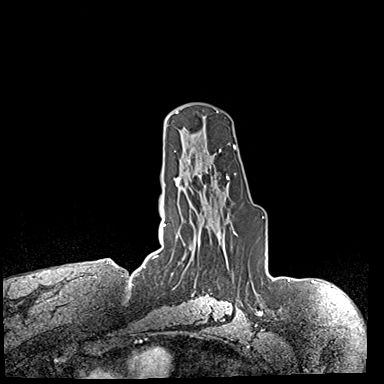
[im 132/176]
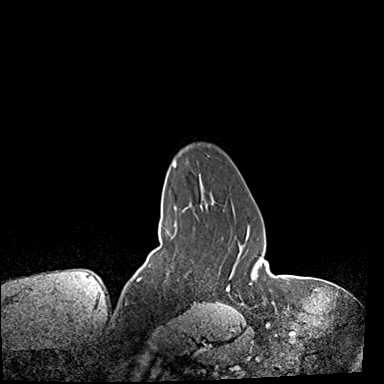
[im 176/176]
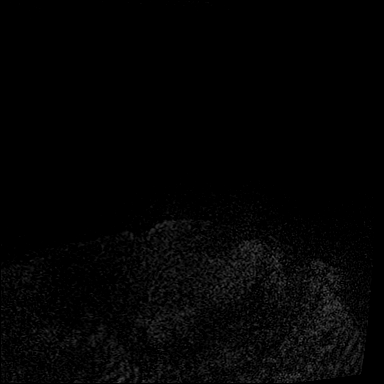

[Series 5: dynamic post 20 · axial · 1.3mm · 0.73mm/px · z∈[-95,+133]mm · 5 of 176 slices shown (2 of 2)]
[im 1/176]
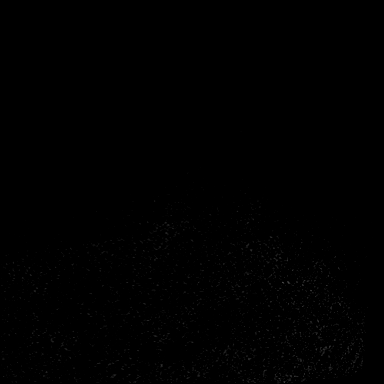
[im 44/176]
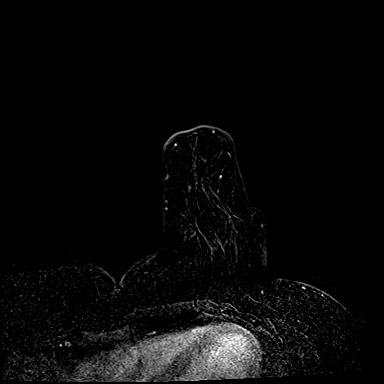
[im 88/176]
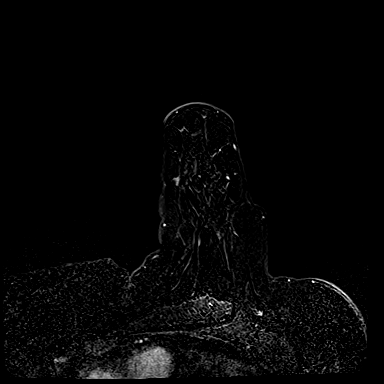
[im 132/176]
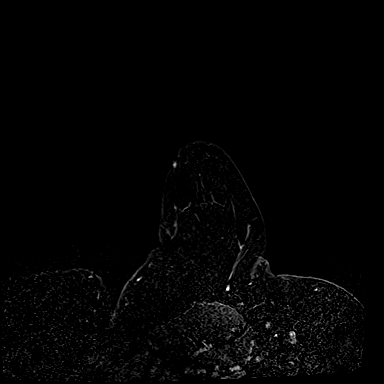
[im 176/176]
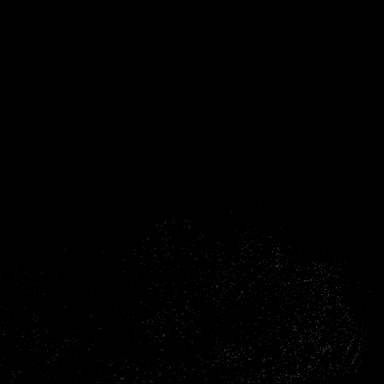

[Series 6: dynamic post 3 · axial · 1.3mm · 0.73mm/px · z∈[-95,+133]mm · 5 of 176 slices shown (1 of 2)]
[im 1/176]
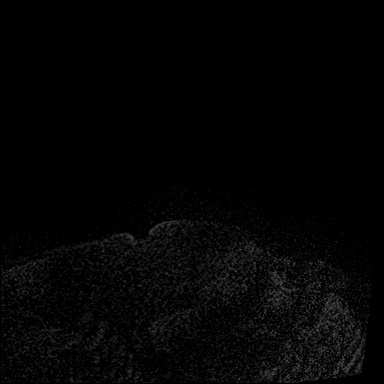
[im 44/176]
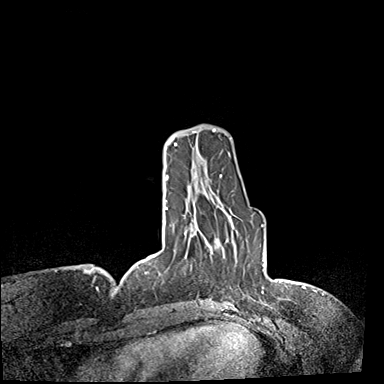
[im 88/176]
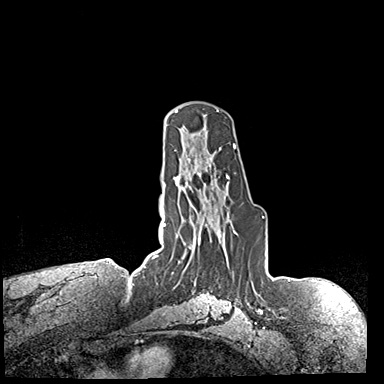
[im 132/176]
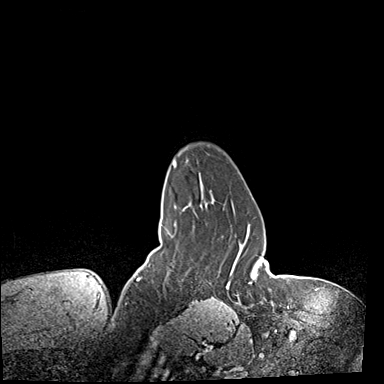
[im 176/176]
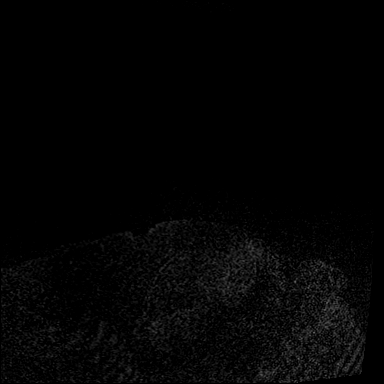

[Series 7: dynamic post 3 · axial · 1.3mm · 0.73mm/px · z∈[-95,+133]mm · 5 of 176 slices shown (2 of 2)]
[im 1/176]
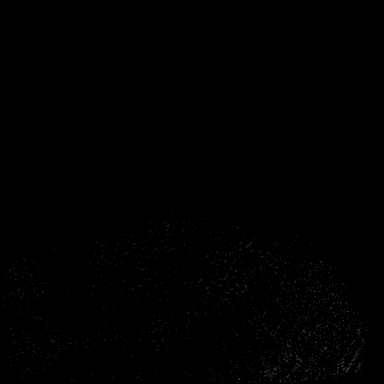
[im 44/176]
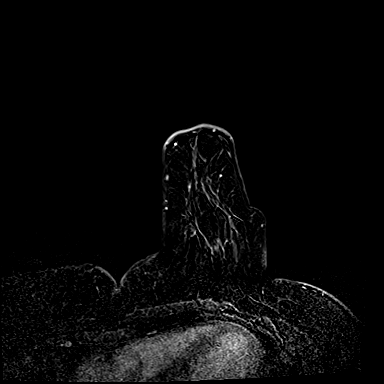
[im 88/176]
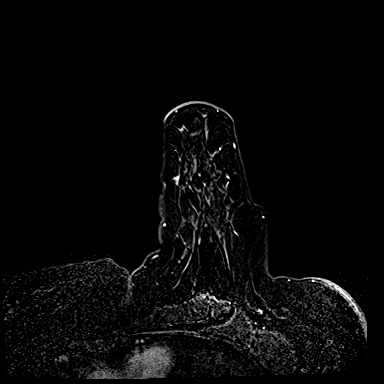
[im 132/176]
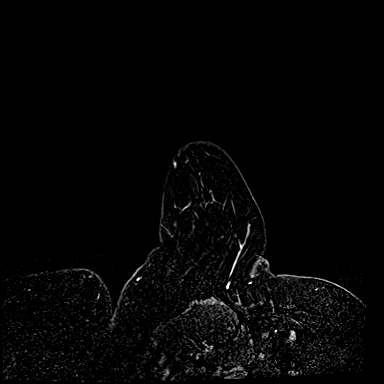
[im 176/176]
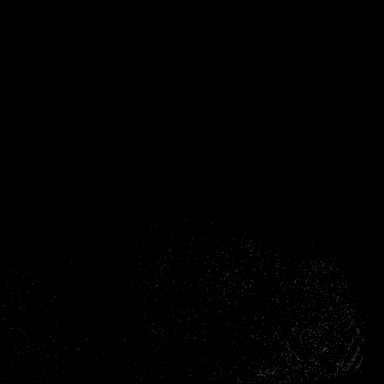

[Series 8: needle confirmation · axial · 1.3mm · 0.73mm/px · z∈[-95,+133]mm · 5 of 176 slices shown]
[im 1/176]
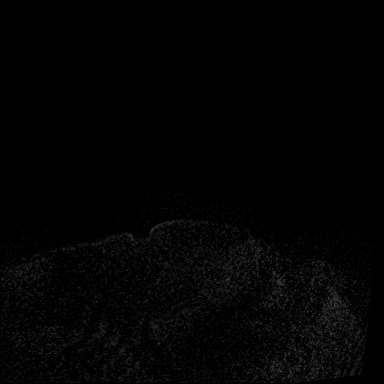
[im 44/176]
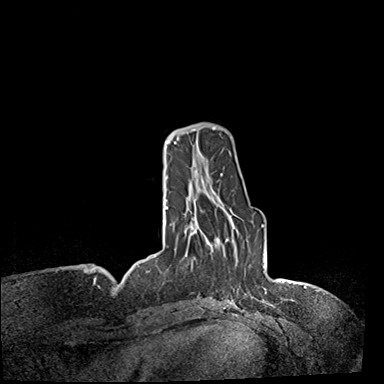
[im 88/176]
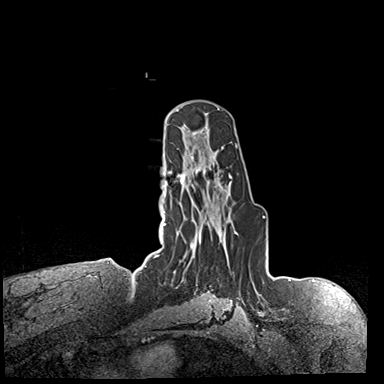
[im 132/176]
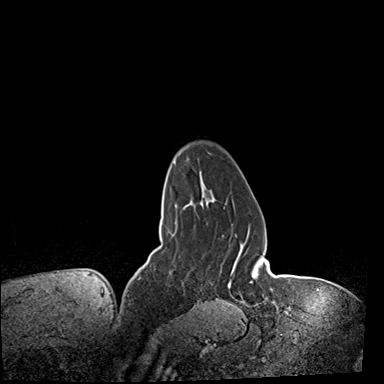
[im 176/176]
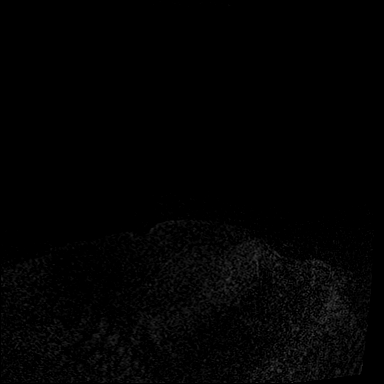

[Series 9: needle confirmation_sub · axial · 1.3mm · 0.73mm/px · 1 of 176 slices shown]
[im 1/176]
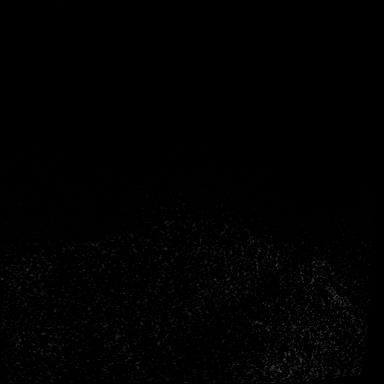

[34 of 48 positions shown; findings below may reference images not displayed]

FINDINGS: I met with the patient, and we discussed the procedure of MRI guided
biopsy, including risks, benefits, and alternatives. Specifically,
we discussed the risks of infection, bleeding, tissue injury, clip
migration, and inadequate sampling. Informed, written consent was
given. The usual time out protocol was performed immediately prior
to the procedure.

Using sterile technique, 1% Lidocaine, MRI guidance, and a 9 gauge
vacuum assisted device, biopsy was performed of an upper inner
quadrant left breast mass using a medial approach. At the conclusion
of the procedure, a cylinder shaped tissue marker clip was deployed
into the biopsy cavity. Follow-up 2-view mammogram was performed and
dictated separately.
IMPRESSION: MRI guided biopsy of the left breast.  No apparent complications.

ADDENDUM:
Pathology revealed FIBROADENOMA of the LEFT breast, upper inner
quadrant. This was found to be concordant by Dr. KIMANI.

Pathology results were discussed with the patient by telephone. The
patient reported doing well after the biopsy with tenderness at the
site. Post biopsy instructions and care were reviewed and questions
were answered. The patient was encouraged to call The [REDACTED]

Bilateral breast MRI recommended in 6 months per protocol.

Pathology results reported by KIMANI RN on [DATE].

*** End of Addendum ***
FINDINGS: I met with the patient, and we discussed the procedure of MRI guided
biopsy, including risks, benefits, and alternatives. Specifically,
we discussed the risks of infection, bleeding, tissue injury, clip
migration, and inadequate sampling. Informed, written consent was
given. The usual time out protocol was performed immediately prior
to the procedure.

Using sterile technique, 1% Lidocaine, MRI guidance, and a 9 gauge
vacuum assisted device, biopsy was performed of an upper inner
quadrant left breast mass using a medial approach. At the conclusion
of the procedure, a cylinder shaped tissue marker clip was deployed
into the biopsy cavity. Follow-up 2-view mammogram was performed and
dictated separately.
IMPRESSION: MRI guided biopsy of the left breast.  No apparent complications.

## 2020-11-03 IMAGING — MG MM BREAST LOCALIZATION CLIP
6 series · 6 of 14 positions shown · non-contrast
Comparison: Previous exam(s).

CLINICAL DATA: 34-year-old female status post MRI guided biopsy of
the left breast.

EXAM:
DIAGNOSTIC LEFT MAMMOGRAM POST MRI BIOPSY

[L CC synth-2D]
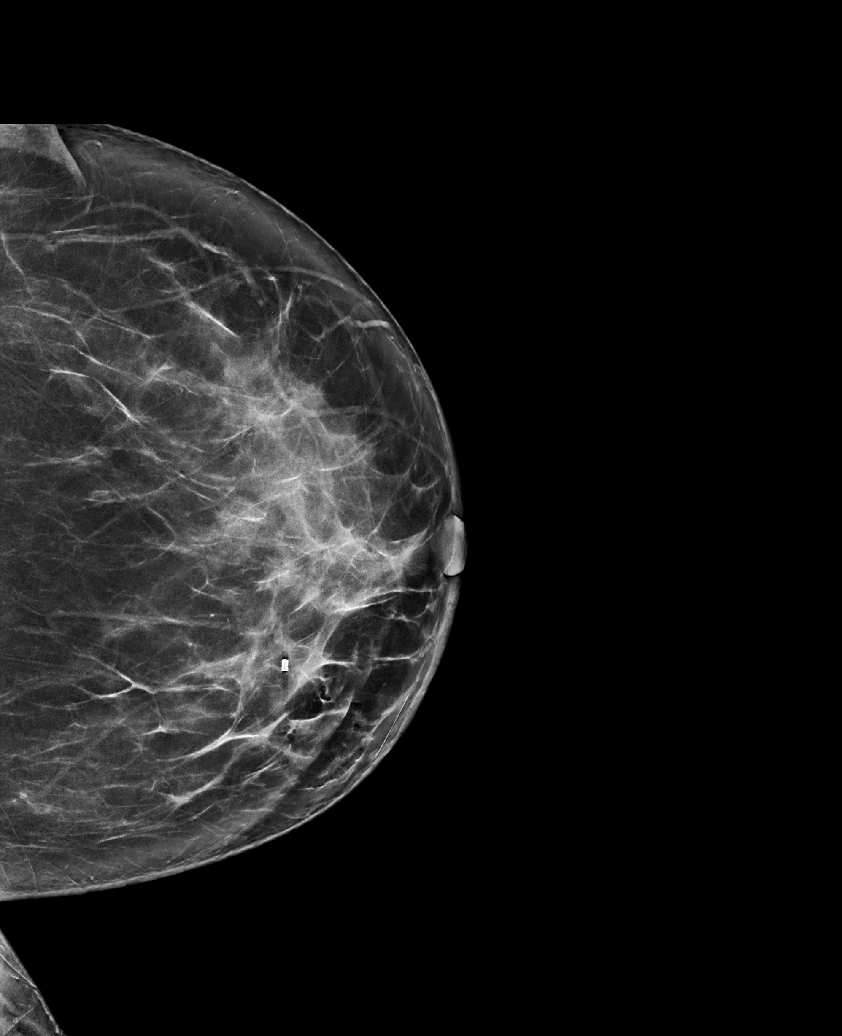

[L ML]
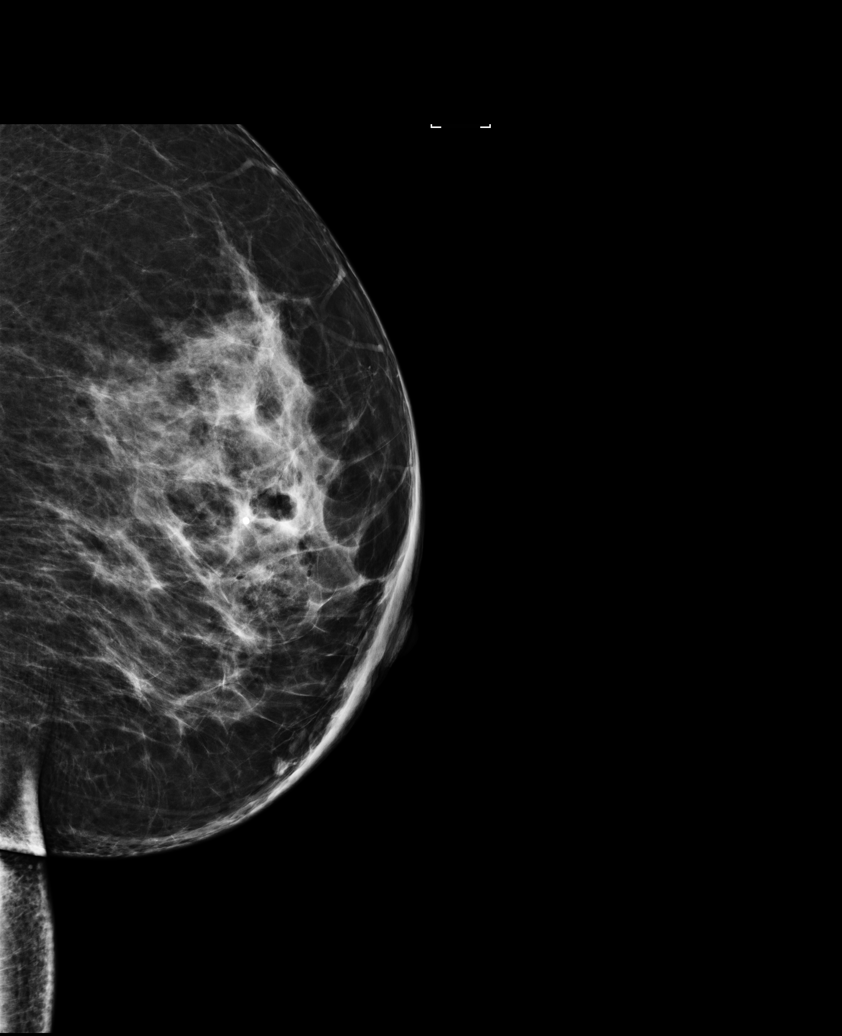

[L CC]
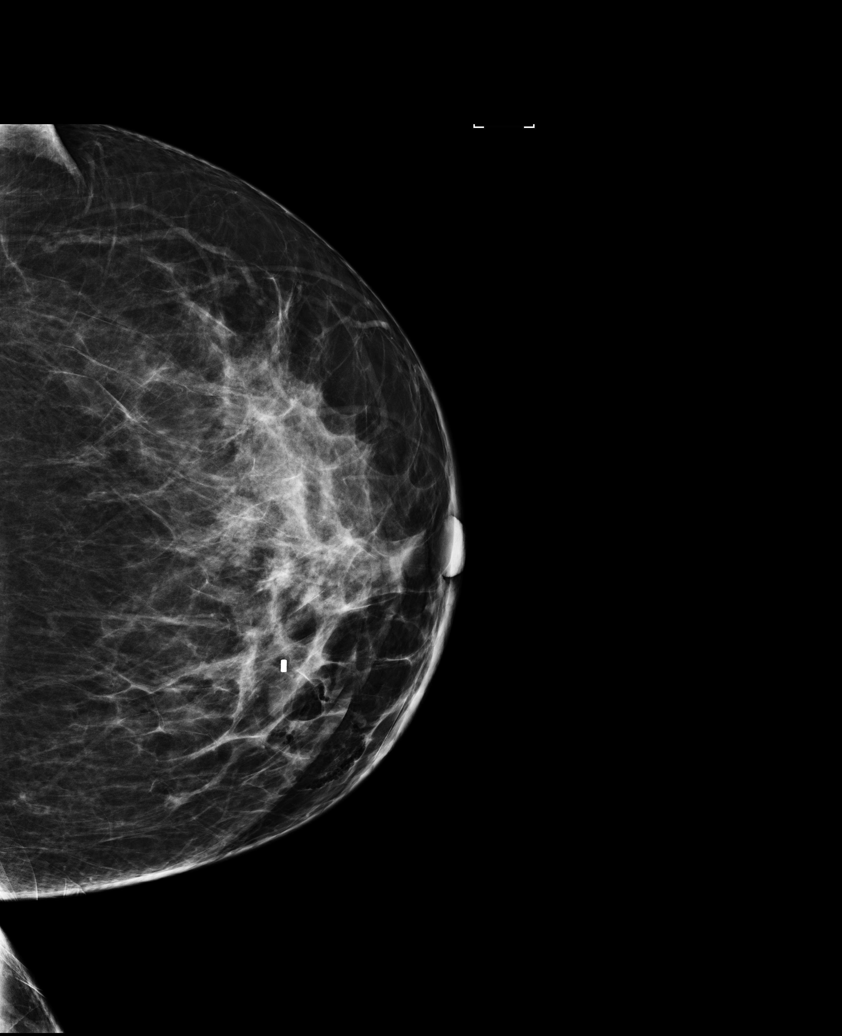

[L ML synth-2D]
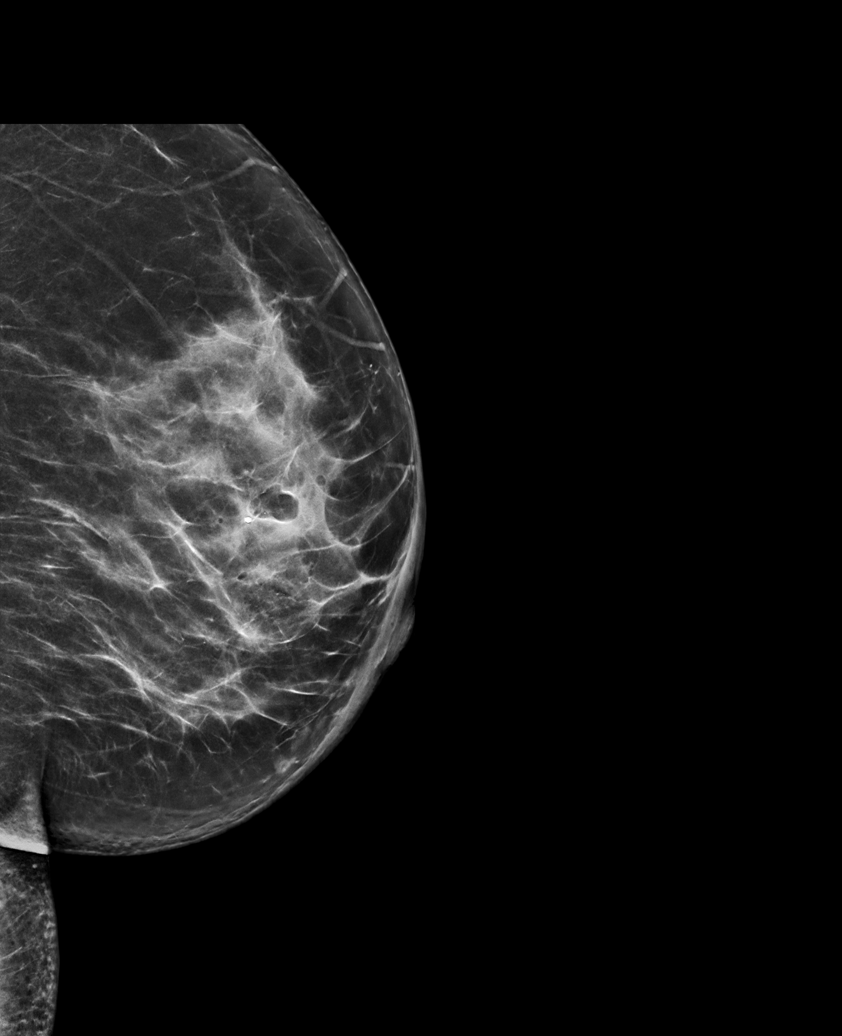

[L ML tomo · tomo slice 44/87.0]
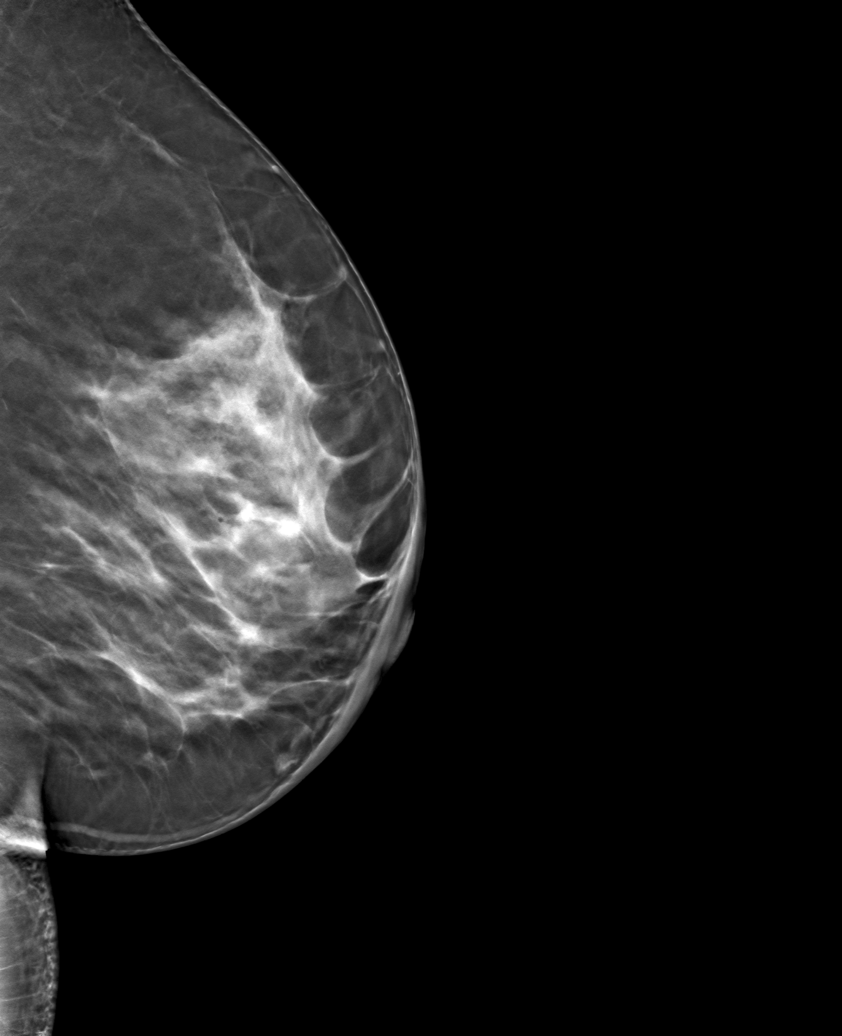

[L CC tomo · tomo slice 43/86.0]
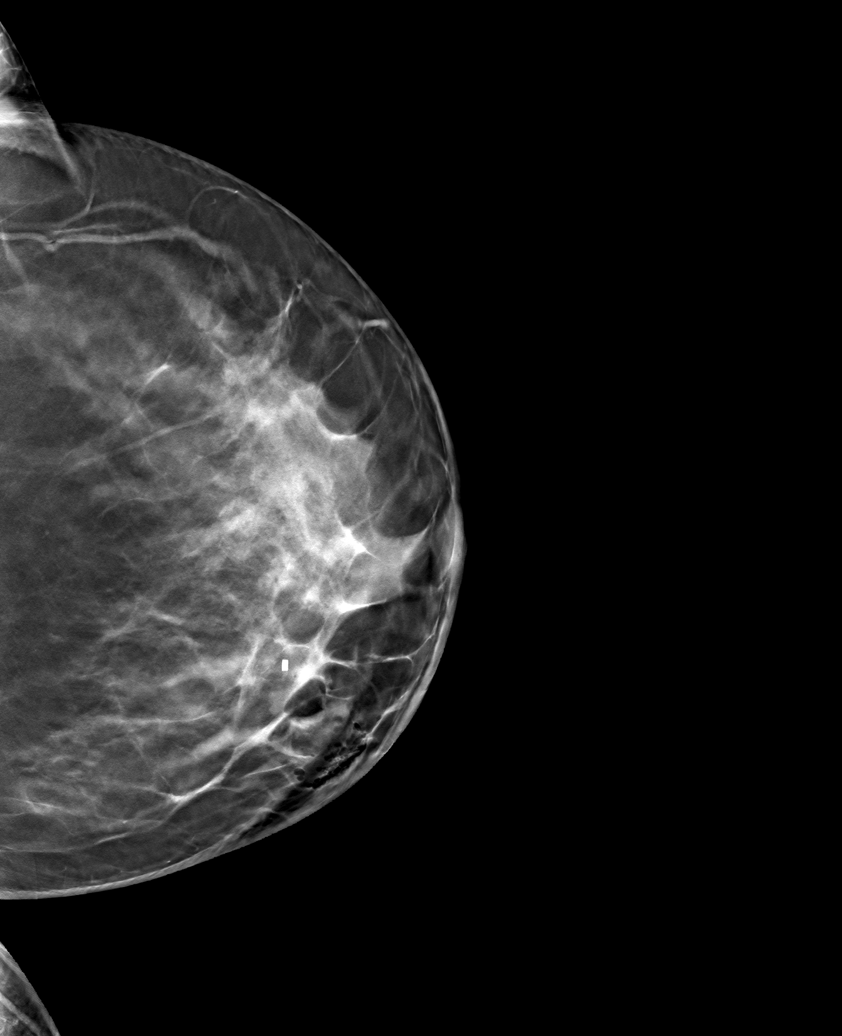

[6 of 14 positions shown; findings below may reference images not displayed]

FINDINGS: Mammographic images were obtained following MRI guided biopsy of the
left breast. The biopsy marking clip is in expected position at the
site of biopsy.
IMPRESSION: Appropriate positioning of the cylinder shaped biopsy marking clip
at the site of biopsy in the upper inner left breast.

Final Assessment: Post Procedure Mammograms for Marker Placement

## 2020-11-03 MED ORDER — GADOBUTROL 1 MMOL/ML IV SOLN
10.0000 mL | Freq: Once | INTRAVENOUS | Status: AC | PRN
  Administered 2020-11-03: 10 mL via INTRAVENOUS

## 2021-03-06 ENCOUNTER — Other Ambulatory Visit: Payer: Self-pay | Admitting: Obstetrics and Gynecology

## 2021-03-06 DIAGNOSIS — Z1231 Encounter for screening mammogram for malignant neoplasm of breast: Secondary | ICD-10-CM

## 2021-05-01 ENCOUNTER — Ambulatory Visit
Admission: RE | Admit: 2021-05-01 | Discharge: 2021-05-01 | Disposition: A | Discharge status: Home / Self Care | Payer: 59 | Source: Ambulatory Visit | Attending: Obstetrics and Gynecology | Admitting: Obstetrics and Gynecology

## 2021-05-01 ENCOUNTER — Other Ambulatory Visit: Payer: Self-pay

## 2021-05-01 DIAGNOSIS — Z1231 Encounter for screening mammogram for malignant neoplasm of breast: Secondary | ICD-10-CM

## 2021-05-01 IMAGING — MG MM DIGITAL SCREENING BILAT W/ TOMO AND CAD
6 of 10 series · 6 of 30 positions shown · non-contrast
Comparison: Previous exam(s).

CLINICAL DATA: Screening.

EXAM:
DIGITAL SCREENING BILATERAL MAMMOGRAM WITH TOMOSYNTHESIS AND CAD
TECHNIQUE: Bilateral screening digital craniocaudal and mediolateral oblique
mammograms were obtained. Bilateral screening digital breast
tomosynthesis was performed. The images were evaluated with
computer-aided detection.

[R CC synth-2D]
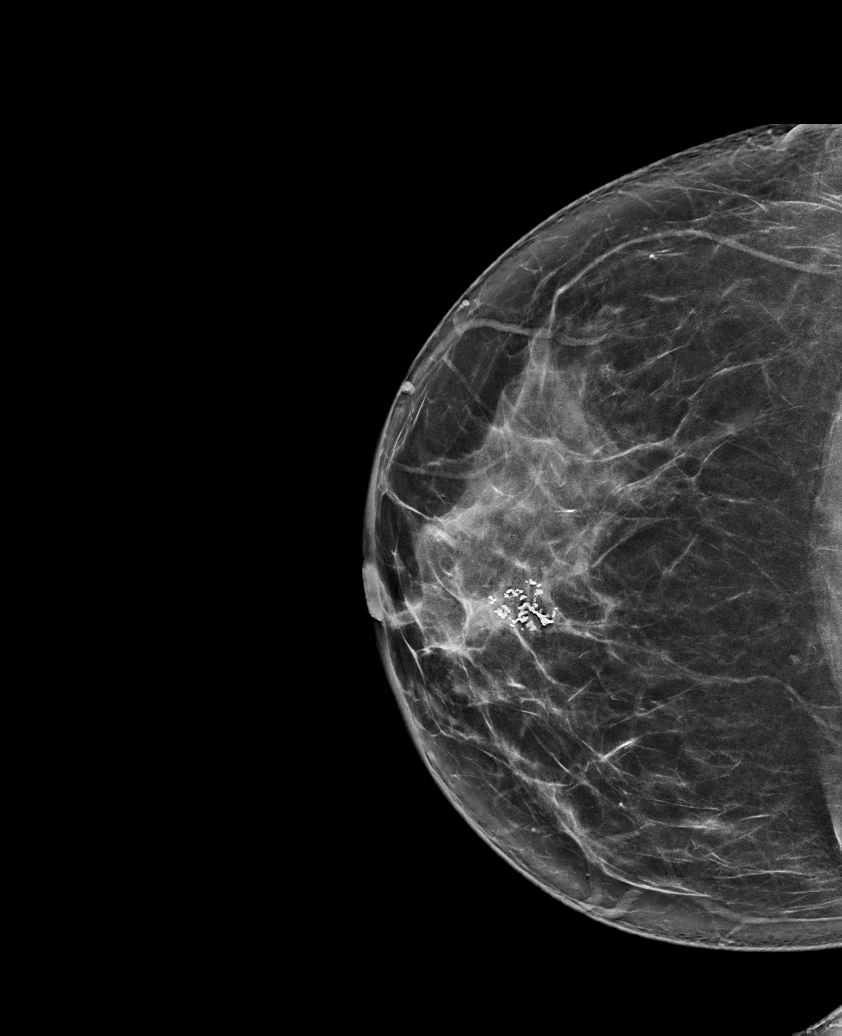

[R MLO synth-2D]
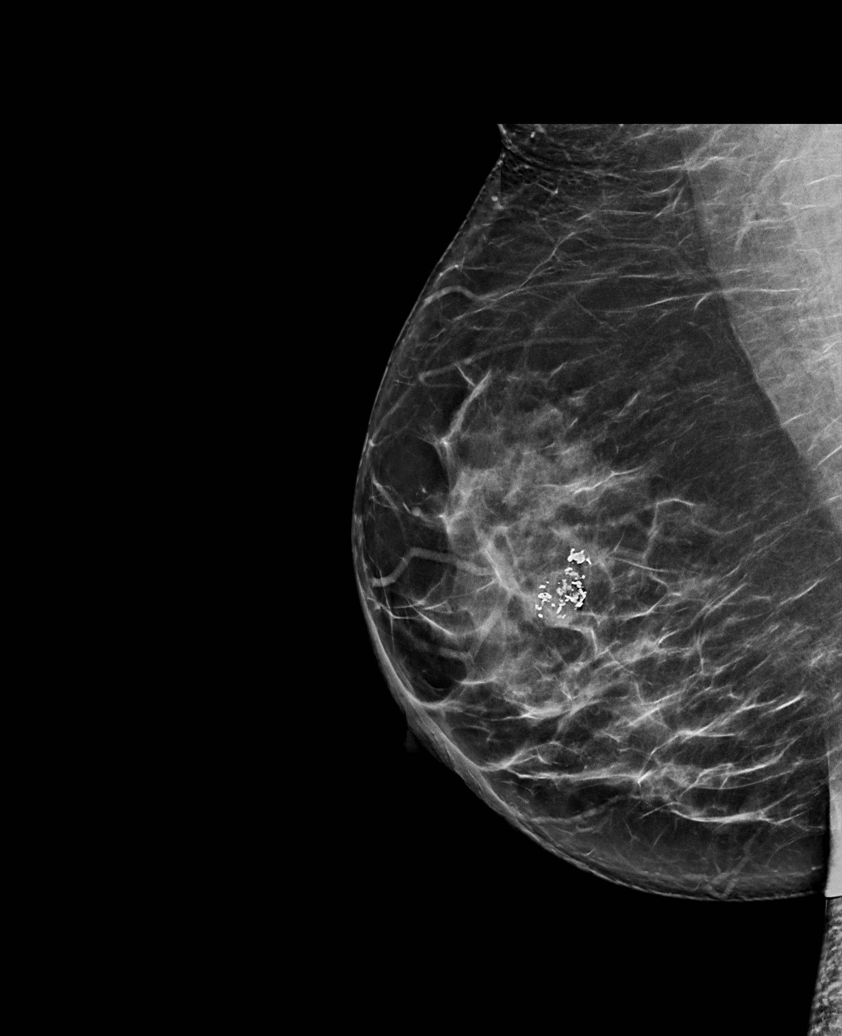

[L CC synth-2D]
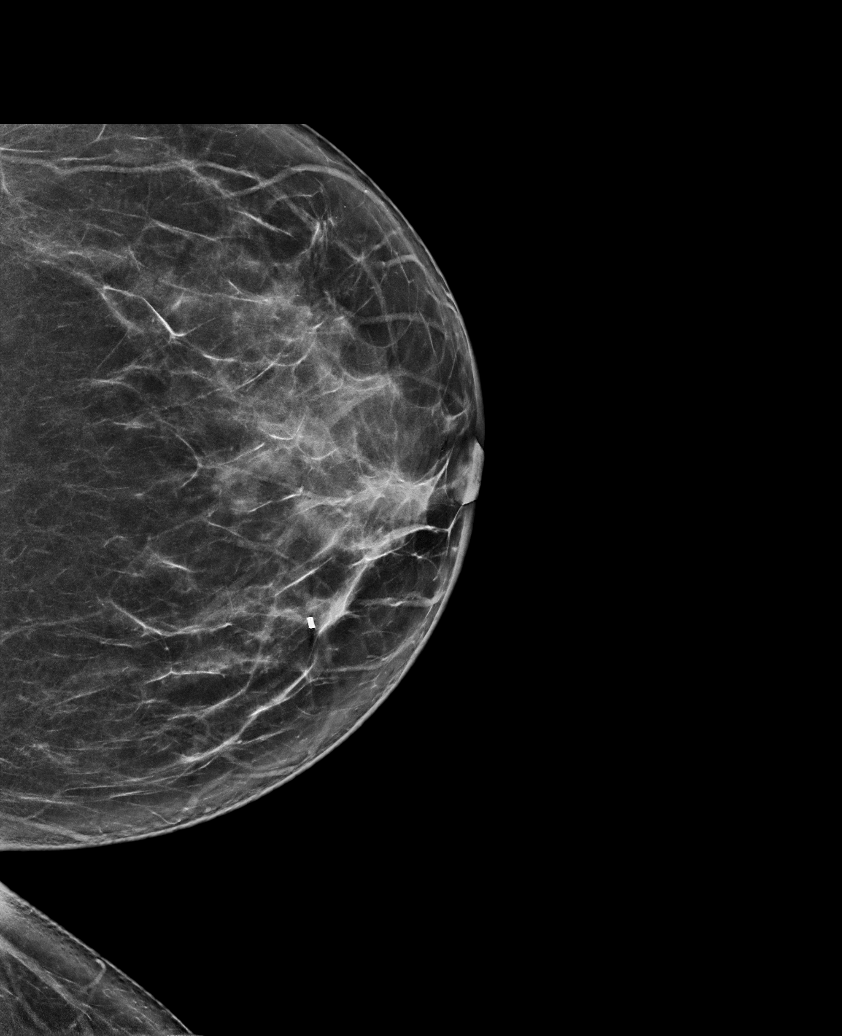

[L MLO synth-2D (1 of 2)]
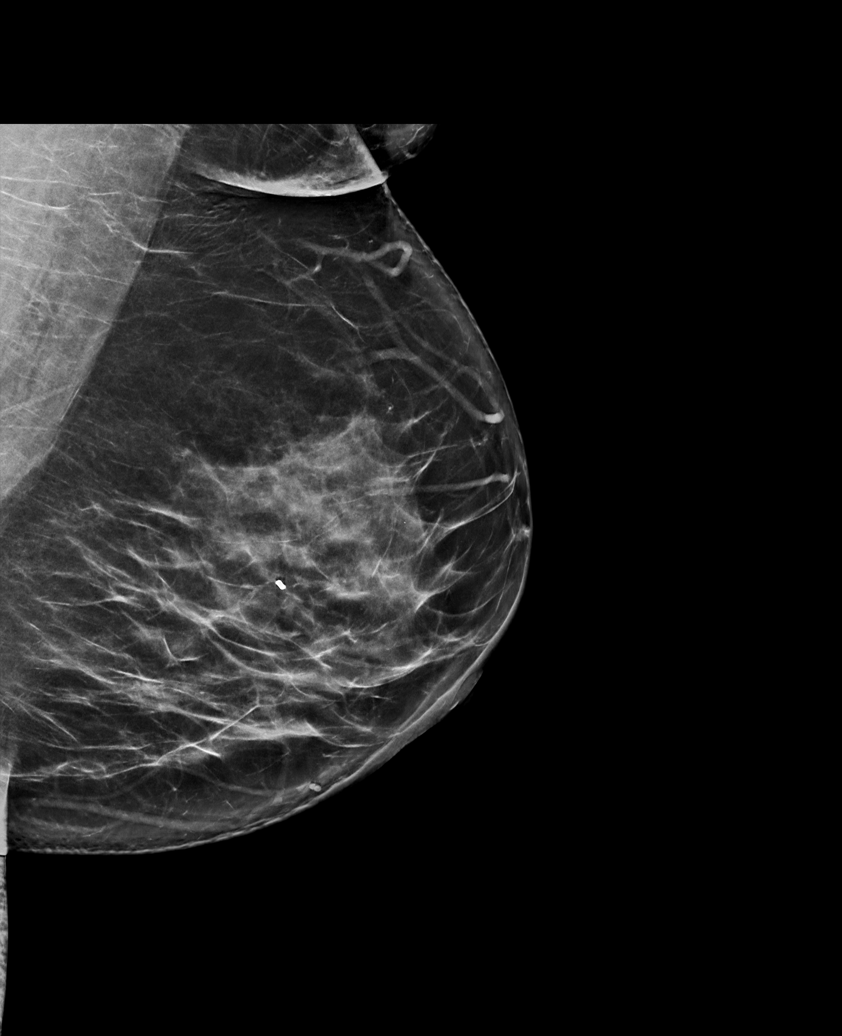

[L MLO synth-2D (2 of 2)]
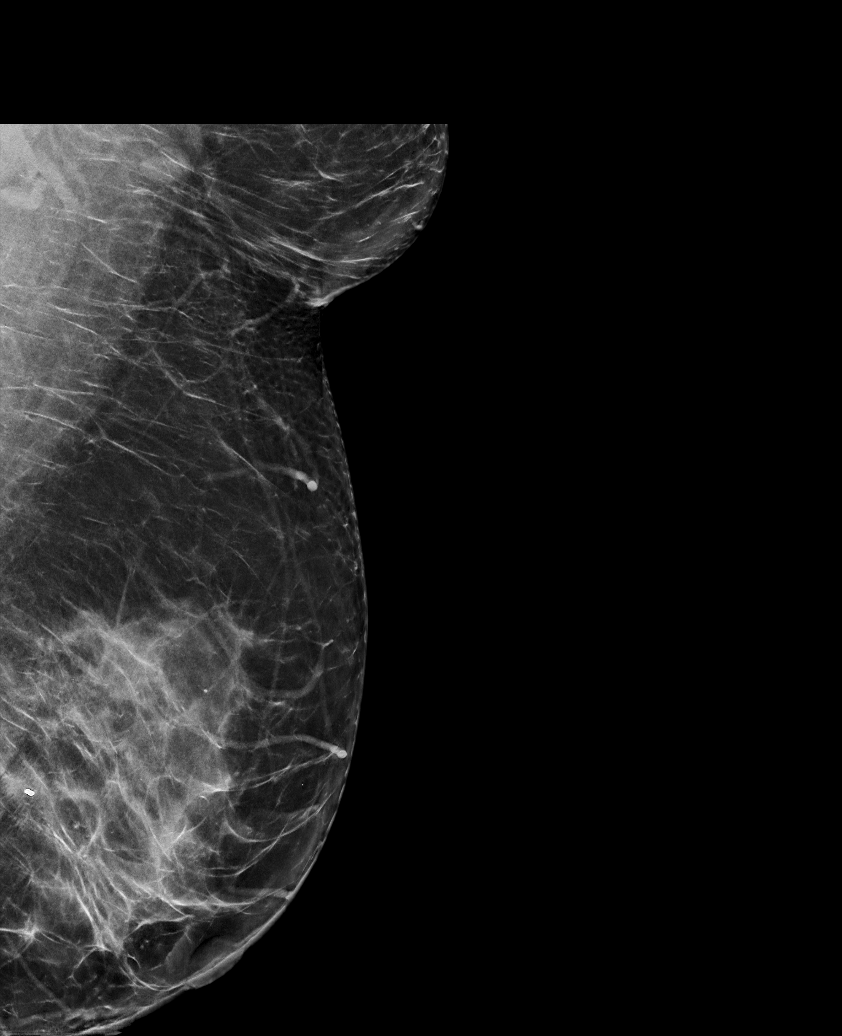

[L MLO tomo · tomo slice 45/88.0]
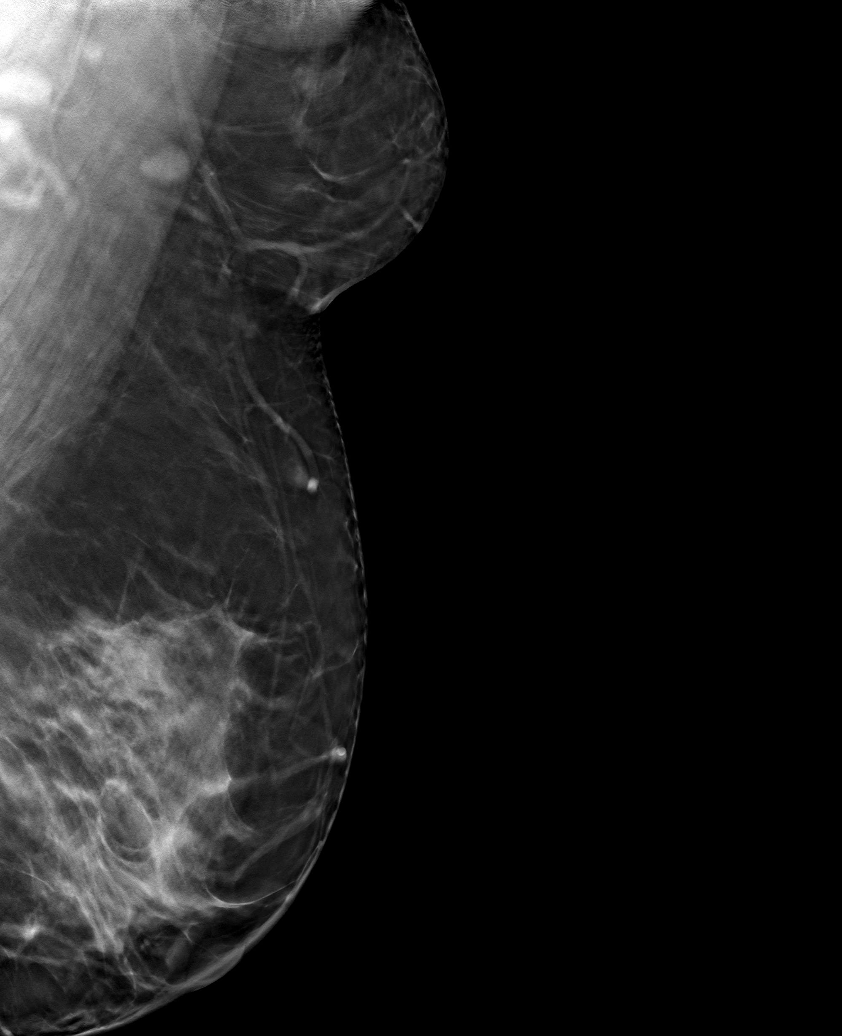

[6 of 30 positions shown; findings below may reference images not displayed]

ACR Breast Density Category c: The breast tissue is heterogeneously
dense, which may obscure small masses.
FINDINGS: There are no findings suspicious for malignancy.
IMPRESSION: No mammographic evidence of malignancy. A result letter of this
screening mammogram will be mailed directly to the patient.

RECOMMENDATION:
Screening mammogram at age 38. (Code:[EO])

BI-RADS CATEGORY  1: Negative.

## 2023-09-24 ENCOUNTER — Encounter: Payer: Self-pay | Admitting: Travel Medicine
